# Patient Record
Sex: Female | Born: 1980 | Hispanic: Yes | Marital: Single | Smoking: Never smoker
Health system: Southern US, Community
[De-identification: ages and names within clinical notes are randomized; demographics above are authoritative.]

## PROBLEM LIST (undated history)

## (undated) DIAGNOSIS — O139 Gestational [pregnancy-induced] hypertension without significant proteinuria, unspecified trimester: Secondary | ICD-10-CM

## (undated) HISTORY — PX: NO PAST SURGERIES: SHX2092

---

## 2005-03-15 ENCOUNTER — Ambulatory Visit: Payer: Self-pay | Admitting: Family Medicine

## 2005-05-09 ENCOUNTER — Ambulatory Visit: Payer: Self-pay | Admitting: Family Medicine

## 2007-12-07 ENCOUNTER — Ambulatory Visit: Payer: Self-pay | Admitting: Certified Nurse Midwife

## 2010-01-08 IMAGING — US US OB US >=[ID] SNGL FETUS
1 series · 17 of 28 positions shown · non-contrast
Comparison: none

REASON FOR EXAM: anatomy placenta location  Spanish Interpreter Needed
COMMENTS:

[Series 1: us ob us >=(id) sngl fetus · 17 of 50 slices shown]
[im 1/50]
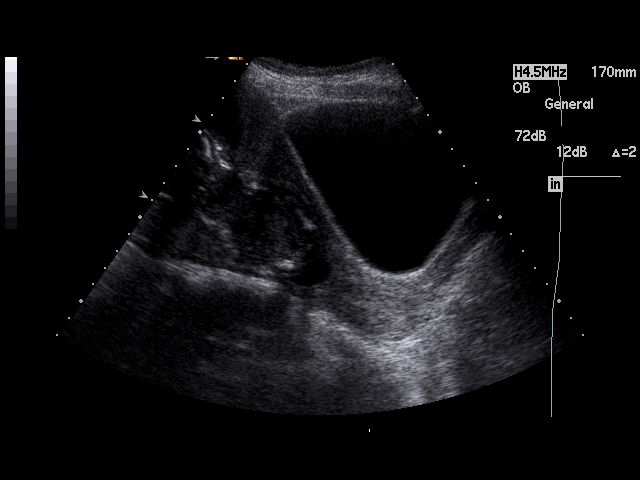
[im 4/50]
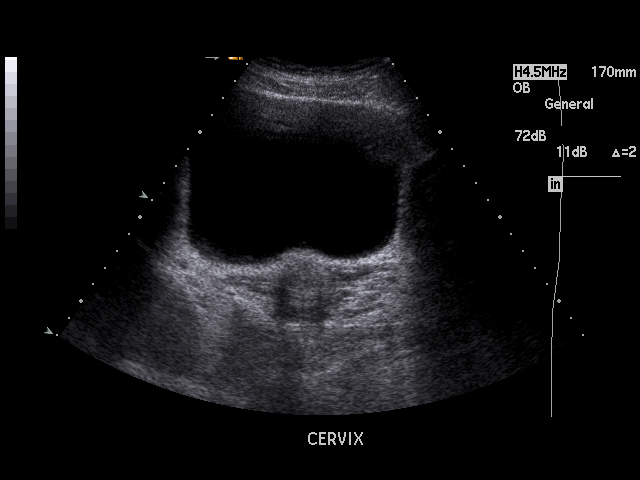
[im 8/50]
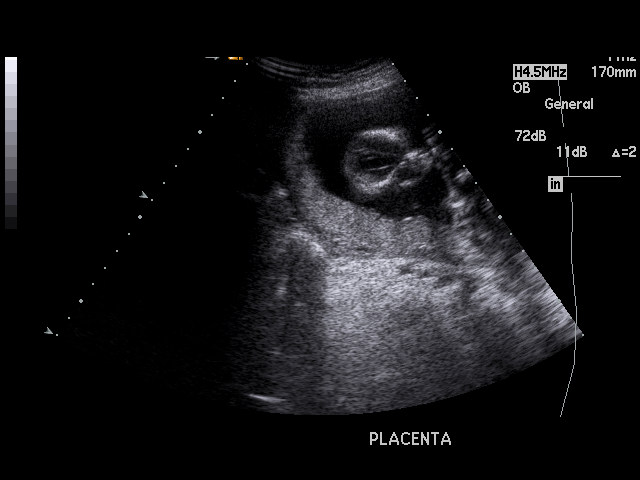
[im 10/50]
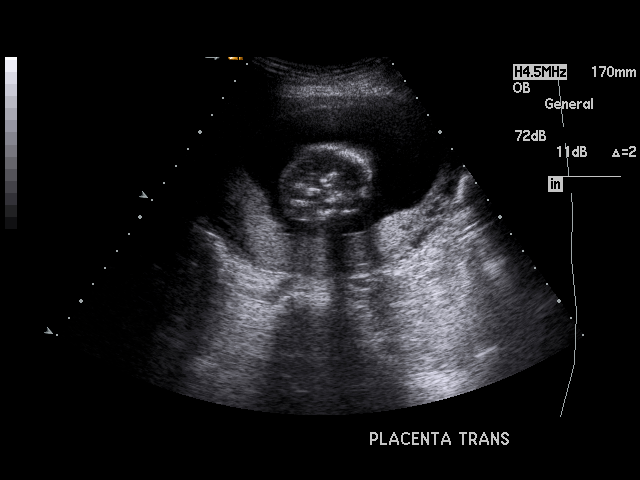
[im 13/50]
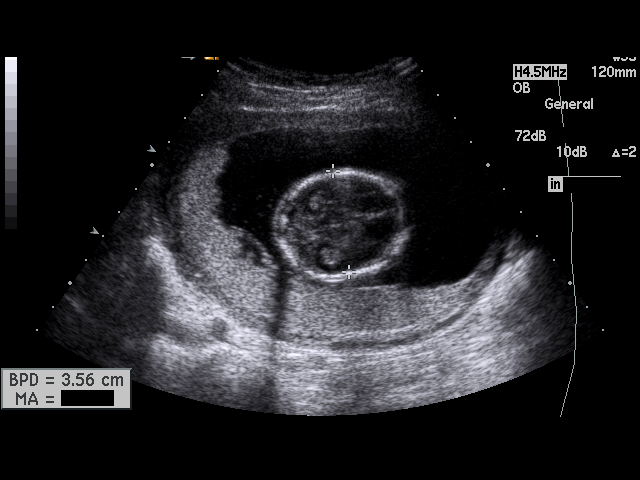
[im 17/50]
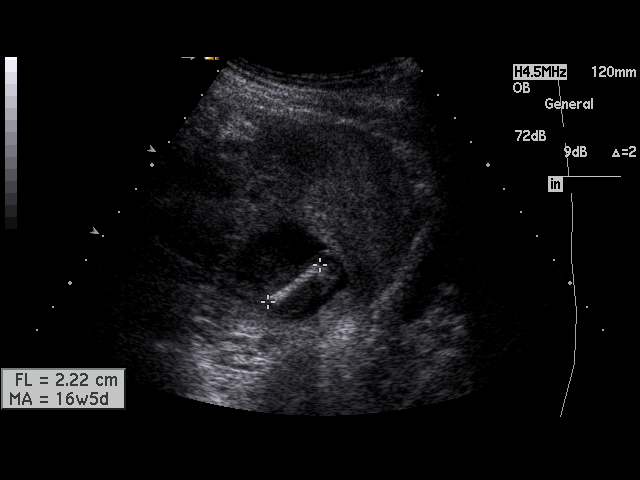
[im 19/50]
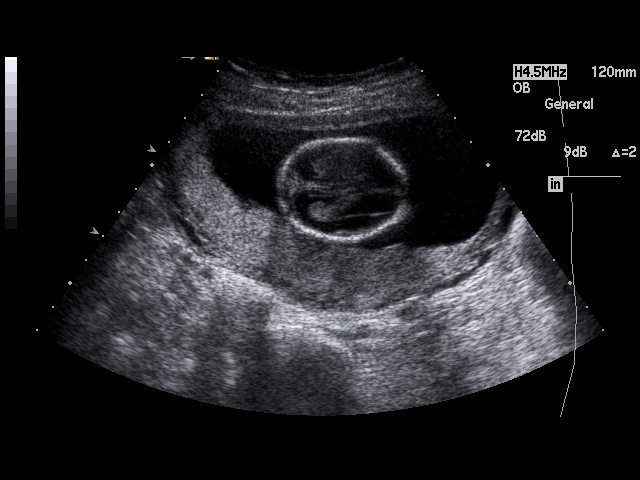
[im 22/50]
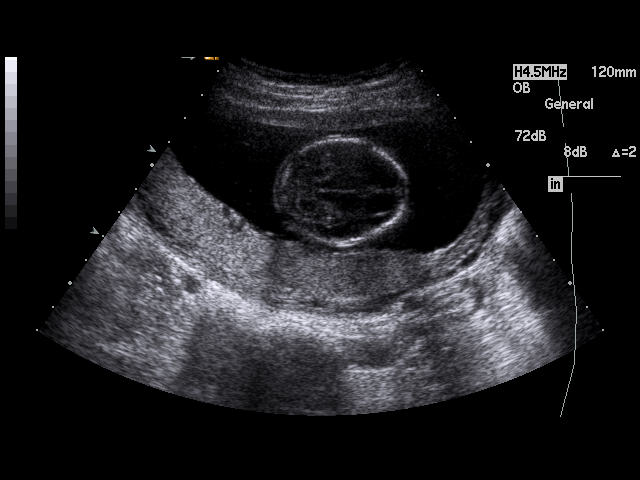
[im 26/50]
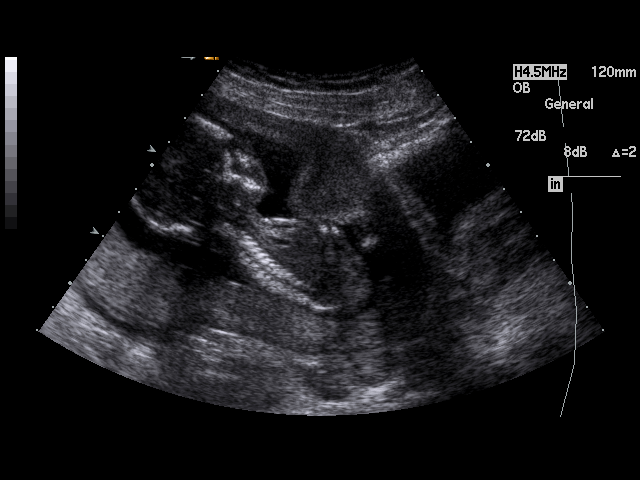
[im 28/50]
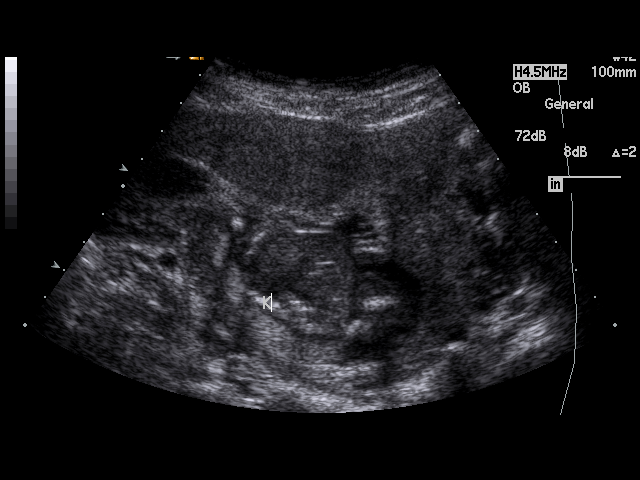
[im 31/50]
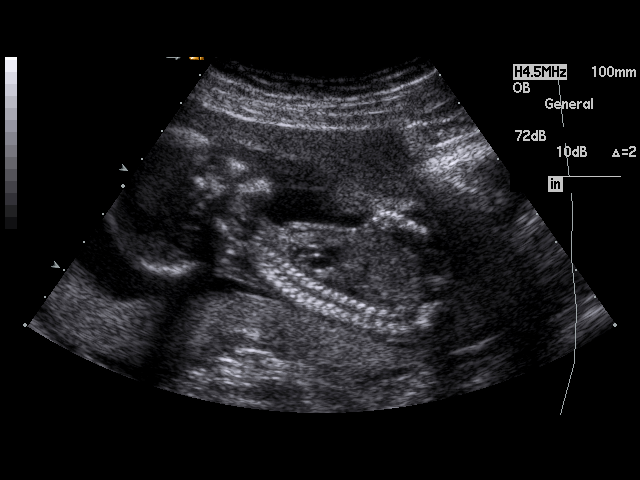
[im 33/50]
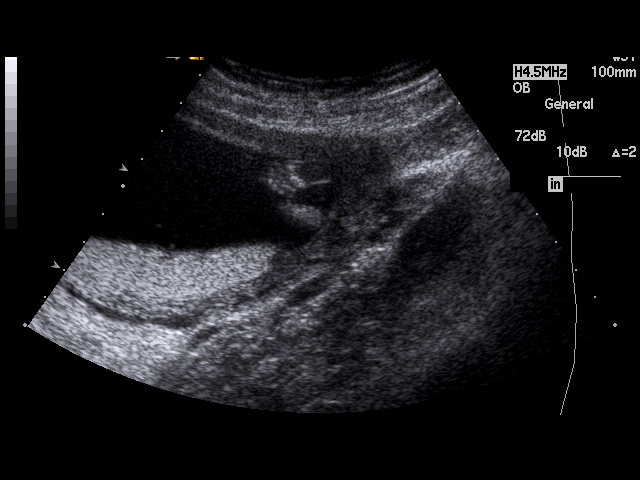
[im 37/50]
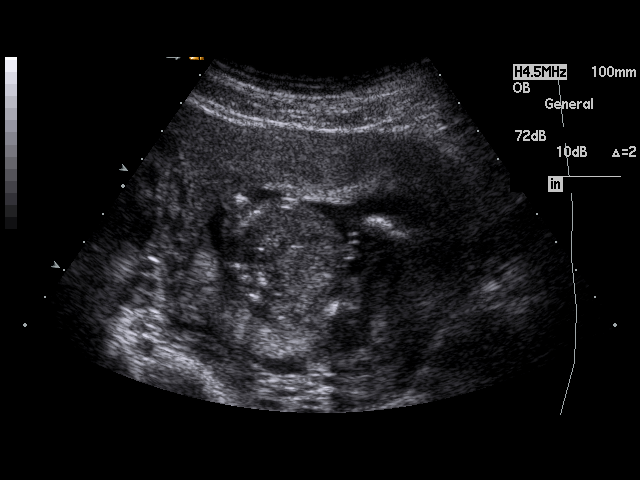
[im 40/50]
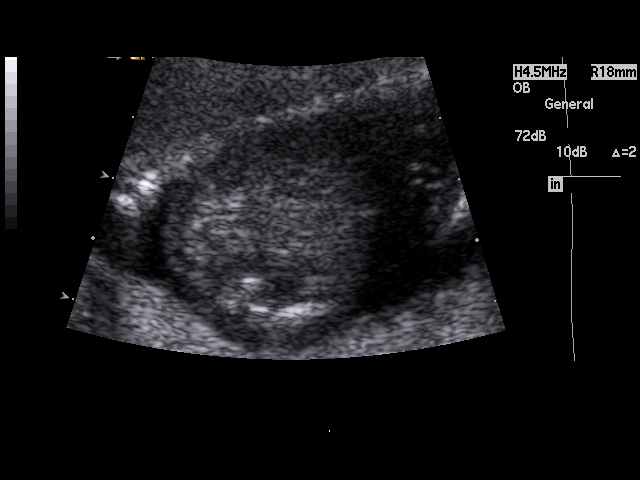
[im 42/50]
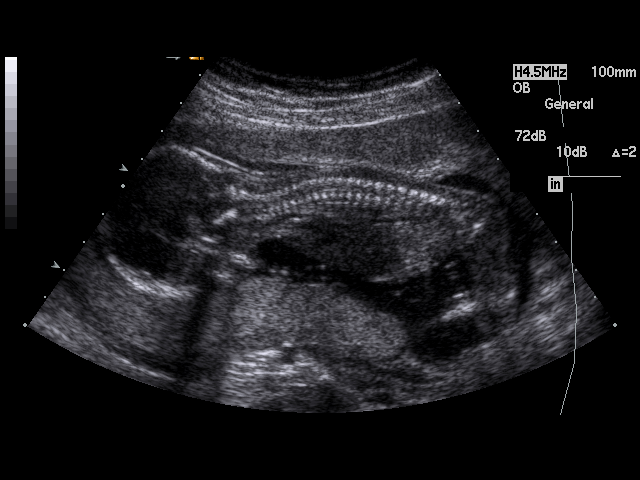
[im 46/50]
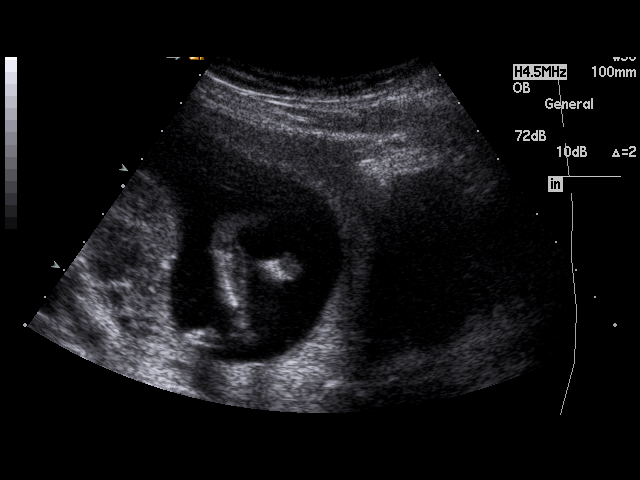
[im 50/50]
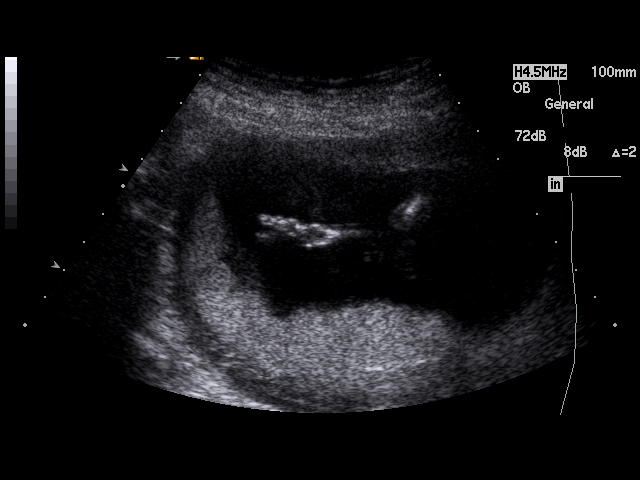

[17 of 28 positions shown; findings below may reference images not displayed]

PROCEDURE:     US  - US OB GREATER/OR EQUAL TO [id number]  - December 07, 2007 [DATE]

RESULT:     There is a viable IUP present with variable presentation. The
placenta is posterior and partially fundal. There is no evidence of placenta
previa. The amnionic fluid volume is estimated to be normal.

The intracranial structures, the craniocervical junction, and the spinal
structures are normal in appearance. A four-chamber heart with a rate 175
beats per minute was demonstrated.

Measured parameters:

BPD: 3.53 cm corresponding to an EGA of [REDACTED] days
HC: 13.83 cm corresponding to an EGA of 17 weeks-7 days
AC: 11.01 cm corresponding to an EGA [REDACTED] days
FL: 2.2 cm corresponding to an EGA of [REDACTED] days

The estimated fetal weight is 191 grams + / - 26 grams.
IMPRESSION: There is a viable IUP with variable presentation with
estimated gestational age of [REDACTED] days plus or minus approximately 10
days. The estimated date of confinement is 17 May, 2008. The placenta is
posterior and partially fundal.

## 2017-10-11 ENCOUNTER — Encounter (INDEPENDENT_AMBULATORY_CARE_PROVIDER_SITE_OTHER): Payer: Self-pay

## 2017-10-11 ENCOUNTER — Ambulatory Visit
Admission: RE | Admit: 2017-10-11 | Discharge: 2017-10-11 | Disposition: A | Discharge status: Home / Self Care | Payer: Self-pay | Source: Ambulatory Visit | Attending: Oncology | Admitting: Oncology

## 2017-10-11 ENCOUNTER — Encounter: Payer: Self-pay | Admitting: *Deleted

## 2017-10-11 ENCOUNTER — Other Ambulatory Visit: Payer: Self-pay

## 2017-10-11 ENCOUNTER — Ambulatory Visit: Payer: Self-pay | Attending: Oncology | Admitting: *Deleted

## 2017-10-11 VITALS — BP 130/86 | HR 62 | Temp 98.4°F | Ht 61.0 in | Wt 143.0 lb

## 2017-10-11 DIAGNOSIS — Z0000 Encounter for general adult medical examination without abnormal findings: Secondary | ICD-10-CM

## 2017-10-11 NOTE — Progress Notes (Signed)
  Subjective:     Patient ID: Jaime Potts, female   DOB: [birth year], 37 y.o.   MRN: [id number]  HPI   Review of Systems     Objective:   Physical Exam  Pulmonary/Chest: Right breast exhibits no inverted nipple, no mass, no nipple discharge, no skin change and no tenderness. Left breast exhibits no inverted nipple, no mass, no nipple discharge, no skin change and no tenderness.       Assessment:     37 year old Hispanic female referred from the [facility] for clinical breast exam and baseline mammogram.  Aquilla Solian, the interpreter present during the interview and exam.  Patient with family history of breast cancer in her sister at age 79.  The patients sister did have genetic testing with a variant of unsignificance.  Patient is considered high risk for breast cancer based on patient's family history of a first degree premenopausal relative with breast cancer.  Clinical breast exam unremarkable. Taught self breast awareness.  Last pap on 07/07/17 was negative without HPV co-testing.  Patient has been screened for eligibility.  She does not have any insurance, Medicare or Medicaid.  She also meets financial eligibility.  Hand-out given on the Affordable Care Act.   Risk Assessment    Risk Scores      10/11/2017   Last edited by: Scarlett Presto, RN   5-year risk: 0.5 %   Lifetime risk: 13.7 %            Plan:     Baseline screening mammogram ordered.  Will follow-up per [facility] protocol.

## 2017-10-11 NOTE — Patient Instructions (Signed)
Gave patient hand-out, Women Staying Healthy, Active and Well from [facility], with education on breast health, pap smears, heart and colon health. 

## 2017-10-12 NOTE — Progress Notes (Signed)
Letter mailed from the [facility] to inform patient of her normal mammogram results.  Patient is to follow-up with annual screening at age 37 per radiology recommendations.  HSIS to [place].

## 2019-01-11 NOTE — L&D Delivery Note (Signed)
Delivery Note At 5:54 AM a viable female was delivered via Vaginal, Spontaneous (Presentation:      ).  APGAR:2/8 , ; weight  .   Placenta status:intact Spontaneous, Intact.  Cord:   with the following complications:  nonreassuring fetal monitoring with worsening of baby's HR ---> c/s converted to stat .Pt taken back to OR emergently once the stat c/s called . Placement of mother on OR table and then noted the baby had delivered and was between her legs . Ashen colored . Tight nuchal cord x 2 reduced . Floppy female passed to nursery staff . APGARS 2/ 8. Cord gas obtained = pending .  Placenta delivered 5 minutes after ? Abruption  Anesthesia:  none Episiotomy:  none Lacerations:  none Suture Repair: none Est. Blood Loss (mL):  250 cc  Mom to postpartum.  Baby to NICU.  Ihor Austin Hanzel Pizzo 09/19/2019, 6:13 AM

## 2019-04-19 LAB — OB RESULTS CONSOLE GC/CHLAMYDIA
Chlamydia: NEGATIVE
Gonorrhea: NEGATIVE

## 2019-04-19 LAB — OB RESULTS CONSOLE VARICELLA ZOSTER ANTIBODY, IGG: Varicella: IMMUNE

## 2019-04-19 LAB — OB RESULTS CONSOLE RUBELLA ANTIBODY, IGM: Rubella: IMMUNE

## 2019-04-19 LAB — OB RESULTS CONSOLE HEPATITIS B SURFACE ANTIGEN: Hepatitis B Surface Ag: NEGATIVE

## 2019-04-19 LAB — OB RESULTS CONSOLE RPR: RPR: NONREACTIVE

## 2019-04-19 LAB — OB RESULTS CONSOLE HIV ANTIBODY (ROUTINE TESTING): HIV: NONREACTIVE

## 2019-09-17 ENCOUNTER — Inpatient Hospital Stay
Admission: EM | Admit: 2019-09-17 | Discharge: 2019-09-24 | DRG: 806 | Disposition: A | Discharge status: Home / Self Care | Payer: Medicaid Other | Attending: Certified Nurse Midwife | Admitting: Certified Nurse Midwife

## 2019-09-17 ENCOUNTER — Other Ambulatory Visit: Payer: Self-pay

## 2019-09-17 DIAGNOSIS — R030 Elevated blood-pressure reading, without diagnosis of hypertension: Secondary | ICD-10-CM | POA: Diagnosis present

## 2019-09-17 DIAGNOSIS — O1413 Severe pre-eclampsia, third trimester: Secondary | ICD-10-CM | POA: Diagnosis present

## 2019-09-17 DIAGNOSIS — O9081 Anemia of the puerperium: Secondary | ICD-10-CM | POA: Diagnosis not present

## 2019-09-17 DIAGNOSIS — O1414 Severe pre-eclampsia complicating childbirth: Secondary | ICD-10-CM | POA: Diagnosis present

## 2019-09-17 DIAGNOSIS — Z20822 Contact with and (suspected) exposure to covid-19: Secondary | ICD-10-CM | POA: Diagnosis present

## 2019-09-17 DIAGNOSIS — D62 Acute posthemorrhagic anemia: Secondary | ICD-10-CM | POA: Diagnosis not present

## 2019-09-17 DIAGNOSIS — Z3A32 32 weeks gestation of pregnancy: Secondary | ICD-10-CM

## 2019-09-17 HISTORY — DX: Gestational (pregnancy-induced) hypertension without significant proteinuria, unspecified trimester: O13.9

## 2019-09-17 LAB — CBC
HCT: 38 % (ref 36.0–46.0)
Hemoglobin: 13.3 g/dL (ref 12.0–15.0)
MCH: 30 pg (ref 26.0–34.0)
MCHC: 35 g/dL (ref 30.0–36.0)
MCV: 85.8 fL (ref 80.0–100.0)
Platelets: 117 10*3/uL — ABNORMAL LOW (ref 150–400)
RBC: 4.43 MIL/uL (ref 3.87–5.11)
RDW: 13.2 % (ref 11.5–15.5)
WBC: 8.6 10*3/uL (ref 4.0–10.5)
nRBC: 0 % (ref 0.0–0.2)

## 2019-09-17 LAB — SARS CORONAVIRUS 2 BY RT PCR (HOSPITAL ORDER, PERFORMED IN ~~LOC~~ HOSPITAL LAB): SARS Coronavirus 2: NEGATIVE

## 2019-09-17 LAB — COMPREHENSIVE METABOLIC PANEL
ALT: 39 U/L (ref 0–44)
AST: 43 U/L — ABNORMAL HIGH (ref 15–41)
Albumin: 2.3 g/dL — ABNORMAL LOW (ref 3.5–5.0)
Alkaline Phosphatase: 191 U/L — ABNORMAL HIGH (ref 38–126)
Anion gap: 7 (ref 5–15)
BUN: 16 mg/dL (ref 6–20)
CO2: 20 mmol/L — ABNORMAL LOW (ref 22–32)
Calcium: 8 mg/dL — ABNORMAL LOW (ref 8.9–10.3)
Chloride: 108 mmol/L (ref 98–111)
Creatinine, Ser: 0.97 mg/dL (ref 0.44–1.00)
GFR calc Af Amer: >60 mL/min (ref >60)
GFR calc non Af Amer: >60 mL/min (ref >60)
Glucose, Bld: 78 mg/dL (ref 70–99)
Potassium: 4.3 mmol/L (ref 3.5–5.1)
Sodium: 135 mmol/L (ref 135–145)
Total Bilirubin: 0.4 mg/dL (ref 0.3–1.2)
Total Protein: 5.2 g/dL — ABNORMAL LOW (ref 6.5–8.1)

## 2019-09-17 LAB — PROTEIN / CREATININE RATIO, URINE
Creatinine, Urine: 50 mg/dL
Protein Creatinine Ratio: 14.74 mg/mg{Cre} — ABNORMAL HIGH (ref 0.00–0.15)
Total Protein, Urine: 737 mg/dL

## 2019-09-17 LAB — TYPE AND SCREEN
ABO/RH(D): O POS
Antibody Screen: NEGATIVE

## 2019-09-17 LAB — ABO/RH: ABO/RH(D): O POS

## 2019-09-17 MED ORDER — MAGNESIUM SULFATE 40 GM/1000ML IV SOLN
2.0000 g/h | INTRAVENOUS | Status: DC
  Administered 2019-09-18: 2 g/h via INTRAVENOUS
  Administered 2019-09-19: 1 g/h via INTRAVENOUS
  Filled 2019-09-17 (×3): qty 1000

## 2019-09-17 MED ORDER — ACETAMINOPHEN 325 MG PO TABS: 650.0000 mg | ORAL_TABLET | ORAL | Status: DC | PRN

## 2019-09-17 MED ORDER — LACTATED RINGERS IV SOLN: INTRAVENOUS | Status: DC

## 2019-09-17 MED ORDER — LABETALOL HCL 5 MG/ML IV SOLN
20.0000 mg | INTRAVENOUS | Status: DC | PRN
  Administered 2019-09-17: 20 mg via INTRAVENOUS
  Filled 2019-09-17 (×3): qty 4

## 2019-09-17 MED ORDER — BETAMETHASONE SOD PHOS & ACET 6 (3-3) MG/ML IJ SUSP
INTRAMUSCULAR | Status: AC
  Filled 2019-09-17: qty 5

## 2019-09-17 MED ORDER — LABETALOL HCL 5 MG/ML IV SOLN
40.0000 mg | INTRAVENOUS | Status: DC | PRN
  Administered 2019-09-20 – 2019-09-21 (×2): 40 mg via INTRAVENOUS
  Filled 2019-09-17 (×2): qty 8

## 2019-09-17 MED ORDER — ZOLPIDEM TARTRATE 5 MG PO TABS: 5.0000 mg | ORAL_TABLET | Freq: Every evening | ORAL | Status: DC | PRN

## 2019-09-17 MED ORDER — LABETALOL HCL 5 MG/ML IV SOLN
80.0000 mg | INTRAVENOUS | Status: DC | PRN
  Administered 2019-09-17: 80 mg via INTRAVENOUS
  Filled 2019-09-17 (×2): qty 16

## 2019-09-17 MED ORDER — PRENATAL MULTIVITAMIN CH
1.0000 | ORAL_TABLET | Freq: Every day | ORAL | Status: DC
  Administered 2019-09-18 – 2019-09-19 (×2): 1 via ORAL
  Filled 2019-09-17 (×2): qty 1

## 2019-09-17 MED ORDER — CALCIUM CARBONATE ANTACID 500 MG PO CHEW: 2.0000 | CHEWABLE_TABLET | ORAL | Status: DC | PRN

## 2019-09-17 MED ORDER — MAGNESIUM SULFATE BOLUS VIA INFUSION
4.0000 g | Freq: Once | INTRAVENOUS | Status: AC
  Administered 2019-09-17: 4 g via INTRAVENOUS
  Filled 2019-09-17: qty 1000

## 2019-09-17 MED ORDER — HYDRALAZINE HCL 20 MG/ML IJ SOLN
10.0000 mg | INTRAMUSCULAR | Status: DC | PRN
  Administered 2019-09-17 – 2019-09-20 (×4): 10 mg via INTRAVENOUS
  Filled 2019-09-17 (×2): qty 1

## 2019-09-17 MED ORDER — CALCIUM GLUCONATE 10 % IV SOLN
INTRAVENOUS | Status: AC
  Filled 2019-09-17: qty 10

## 2019-09-17 MED ORDER — HYDRALAZINE HCL 20 MG/ML IJ SOLN
5.0000 mg | INTRAMUSCULAR | Status: DC | PRN
  Administered 2019-09-17 – 2019-09-21 (×9): 5 mg via INTRAVENOUS
  Filled 2019-09-17 (×5): qty 1

## 2019-09-17 MED ORDER — HYDRALAZINE HCL 20 MG/ML IJ SOLN
INTRAMUSCULAR | Status: AC
  Administered 2019-09-19: 5 mg via INTRAVENOUS
  Filled 2019-09-17: qty 1

## 2019-09-17 MED ORDER — LABETALOL HCL 5 MG/ML IV SOLN
20.0000 mg | INTRAVENOUS | Status: DC | PRN
  Administered 2019-09-19 – 2019-09-21 (×3): 20 mg via INTRAVENOUS
  Filled 2019-09-17: qty 4

## 2019-09-17 MED ORDER — LABETALOL HCL 5 MG/ML IV SOLN
40.0000 mg | INTRAVENOUS | Status: DC | PRN
  Administered 2019-09-17: 40 mg via INTRAVENOUS
  Filled 2019-09-17: qty 8

## 2019-09-17 MED ORDER — DOCUSATE SODIUM 100 MG PO CAPS
100.0000 mg | ORAL_CAPSULE | Freq: Every day | ORAL | Status: DC
  Administered 2019-09-18 – 2019-09-19 (×2): 100 mg via ORAL
  Filled 2019-09-17 (×2): qty 1

## 2019-09-17 MED ORDER — BETAMETHASONE SOD PHOS & ACET 6 (3-3) MG/ML IJ SUSP
12.0000 mg | INTRAMUSCULAR | Status: AC
  Administered 2019-09-17 – 2019-09-18 (×2): 12 mg via INTRAMUSCULAR
  Filled 2019-09-17: qty 5

## 2019-09-17 NOTE — Progress Notes (Signed)
Pt's first BP in severe range. IV labetalol ordered promptly at 1925. IV unable to be obtained and po nifedipine ordered. However, iv obtained prior to po nifedipine being given.

## 2019-09-17 NOTE — H&P (Addendum)
OB ADMISSION/ HISTORY & PHYSICAL:  Admission Date: 09/17/2019  6:59 PM  Admit Diagnosis: Elevated BP in pregnancy  Jaime Potts is a 39 y.o. [id number] at 32+4wks presenting for elevated BP to severe range at her PNV at [facility]. No headache now, 1 last week. Flashing lights behind eyes last week, now resolved. No RUB, no epigastric pain. +bilateral le edema and ue edema. No SOB. First BP on arrival 194/86.  Prior pregnancy with PreE, delivered at term. Last NSVD no complications.  Dated by ultrasound at 11 wks Prior pregnancies 14 and 10 yrs ago.  Pt is Spanish-speaking only, and was seen with interpreter to discuss care.  Prenatal History: [id number]   EDC : 11/08/2019, by Ultrasound at 11wks Prenatal care at [facility] Prenatal course complicated by  - elevated glucola, normal 3hr - hx of preE  Medical / Surgical History :  Past medical history:  Past Medical History:  Diagnosis Date  . Pregnancy induced hypertension      Past surgical history:  Past Surgical History:  Procedure Laterality Date  . NO PAST SURGERIES      Family History:  Family History  Problem Relation Age of Onset  . Breast cancer Sister 32     Social History:  reports that she has never smoked. She has never used smokeless tobacco. She reports that she does not drink alcohol and does not use drugs.   Allergies: Patient has no known allergies.    Current Medications at time of admission:  Prior to Admission medications   Medication Sig Start Date End Date Taking? Authorizing Provider  acetaminophen (TYLENOL) 500 MG tablet Take 500 mg by mouth every 6 (six) hours as needed.   Yes [provider]  prenatal vitamin w/FE, FA (PRENATAL 1 + 1) 27-1 MG TABS tablet Take 1 tablet by mouth daily at 12 noon.   Yes [provider]     Review of Systems: Active FM NO headache, SOB, visual changes, RUQ pain, epigastric or back pain.   Physical Exam:  VS: Blood pressure (!)  152/76, pulse 60, temperature 98.1 F (36.7 C), temperature source Oral, resp. rate 20, height 5' (1.524 m), weight 80.7 kg, last menstrual period 02/05/2019, SpO2 98 %.  General: alert and oriented, appears NAD Heart: RRR Lungs: Clear lung fields x6 LE: 2+ bilateral LE reflexes Abdomen: Gravid, soft and non-tender, non-distended Extremities: 3+ pitting edema, 2 beats clonus  FHT: 135, moderate variability, +accels, occasional variable decels TOCO:rare SVE:    /   /   deferred     Prenatal Labs: Blood type/Rh --/--/O POS (09/07 1953)  Antibody screen neg  Rubella Immune  Varicella Immune  RPR NR  HBsAg Neg  HIV NR  GC neg  Chlamydia neg  Genetic screening negative  1 hour GTT elevated  3 hour GTT normal  GBS pending   No results found.  Assessment: 102w4d weeks gestation PreE with severe features FHR category II with occasional variables, expected for gestational age   Plan:  Admit for inpatient secondary to severe range BP with significant urine protein:cr ratio >12,000. Plts low at 117.   AST slightly elevated at 43. ALT wnl. Urine protein:ratio here still pending, but significantly elevated at outside lab.  Covid test negative  1. Continue hospital monitoring of maternal and fetal condition; 2. Repeat renal, hepatic and platelet functions 1-2 times/week, and will tomorrow to trend plts and liver enzymes, 3. Fetal monitoring reassuring. For now, 2x daily NST's.  4. Will get fetal growth, umbilical artery dopplers and AFI. If all normal, will start twice weekly U/S reassessment of AFI. 5. Continue iv antihypertensives to control BP to 150s range, and <160. Will keep IV access for control if needed. 6. Deliver if evidence of worsening headaches, BP's persistently >160 mmHg systolic, >100 mmHg diastolic; persistent headaches or visual signs;  abnormal renal or hepatic function tests ( >2X Normal), platelet count <100K/uL; bleeding; abnormal fetal testing.  7.  Deliver at 34 weeks if stable before then. 8. She has received 1 dose steroids for fetal lung maturity. Second dose due tomorrow night at 10pm. 9. 24hours of magnesium on admission, and will monitor for discontinuation based on results and symptoms tomorrow. 10. P:C ratio in our system still pending. Once established proteinuria, will not need to repeat. 11. GBS pending  Will repeat labs 1-2x daily until trend established. Next blood draw tomorrow at 10am scheduled.  Regular diet for now. Ambulation as tolerated.

## 2019-09-17 NOTE — OB Triage Note (Signed)
Pt presents to unit sent [facility] due to increased BP in office and facial swelling. Pt reports noticing the increased swelling for the past 2 weeks and some blurry vision but not currently. Pt denies HA and right epigastric pain. Pt reports +FM, denies ctx, LOF, and vaginal bleeding.

## 2019-09-18 ENCOUNTER — Inpatient Hospital Stay: Payer: Medicaid Other

## 2019-09-18 LAB — COMPREHENSIVE METABOLIC PANEL
ALT: 43 U/L (ref 0–44)
AST: 49 U/L — ABNORMAL HIGH (ref 15–41)
Albumin: 2.4 g/dL — ABNORMAL LOW (ref 3.5–5.0)
Alkaline Phosphatase: 195 U/L — ABNORMAL HIGH (ref 38–126)
Anion gap: 11 (ref 5–15)
BUN: 14 mg/dL (ref 6–20)
CO2: 17 mmol/L — ABNORMAL LOW (ref 22–32)
Calcium: 7.5 mg/dL — ABNORMAL LOW (ref 8.9–10.3)
Chloride: 101 mmol/L (ref 98–111)
Creatinine, Ser: 0.96 mg/dL (ref 0.44–1.00)
GFR calc Af Amer: >60 mL/min (ref >60)
GFR calc non Af Amer: >60 mL/min (ref >60)
Glucose, Bld: 217 mg/dL — ABNORMAL HIGH (ref 70–99)
Potassium: 4.3 mmol/L (ref 3.5–5.1)
Sodium: 129 mmol/L — ABNORMAL LOW (ref 135–145)
Total Bilirubin: 0.4 mg/dL (ref 0.3–1.2)
Total Protein: 5.3 g/dL — ABNORMAL LOW (ref 6.5–8.1)

## 2019-09-18 LAB — CBC
HCT: 40.6 % (ref 36.0–46.0)
Hemoglobin: 14.3 g/dL (ref 12.0–15.0)
MCH: 30.5 pg (ref 26.0–34.0)
MCHC: 35.2 g/dL (ref 30.0–36.0)
MCV: 86.6 fL (ref 80.0–100.0)
Platelets: 142 10*3/uL — ABNORMAL LOW (ref 150–400)
RBC: 4.69 MIL/uL (ref 3.87–5.11)
RDW: 13.5 % (ref 11.5–15.5)
WBC: 9 10*3/uL (ref 4.0–10.5)
nRBC: 0 % (ref 0.0–0.2)

## 2019-09-18 LAB — GROUP B STREP BY PCR: Group B strep by PCR: NEGATIVE

## 2019-09-18 MED ORDER — TERBUTALINE SULFATE 1 MG/ML IJ SOLN: 0.2500 mg | Freq: Once | INTRAMUSCULAR | Status: DC | PRN

## 2019-09-18 MED ORDER — MISOPROSTOL 25 MCG QUARTER TABLET
ORAL_TABLET | ORAL | Status: AC
  Filled 2019-09-18: qty 2

## 2019-09-18 MED ORDER — MISOPROSTOL 25 MCG QUARTER TABLET
25.0000 ug | ORAL_TABLET | ORAL | Status: DC
  Administered 2019-09-18 (×2): 25 ug via VAGINAL
  Filled 2019-09-18: qty 1

## 2019-09-18 MED ORDER — MISOPROSTOL 25 MCG QUARTER TABLET
25.0000 ug | ORAL_TABLET | ORAL | Status: DC
  Administered 2019-09-18 (×2): 25 ug via ORAL
  Filled 2019-09-18: qty 1

## 2019-09-18 MED ORDER — MISOPROSTOL 25 MCG QUARTER TABLET: 25.0000 ug | ORAL_TABLET | ORAL | Status: DC | PRN

## 2019-09-18 NOTE — Progress Notes (Signed)
Patient ID: Jaime Potts, female   DOB: [birth year], 39 y.o.   MRN: [id number] Dr Francena Hanly notes reviewed and update by her in am .  Currently following her for severe preeclamsia. BP difficult to control with labetalol and hydralazine . LAbs notable for elevated ast ( 43 , normal <41) and plt 117 --- > now 142 P/Cr ratio marked elevation 14.7 Pt has received one dose of Betamthasone and she is on Magnesium for Sz prophylaxis .   u/s :   CLINICAL DATA:  Preeclampsia  EXAM: OBSTETRICAL ULTRASOUND >14 WKS AND BIOPHYSICAL PROFILE  FINDINGS: Number of Fetuses: 1  Heart Rate:  129 bpm  Movement: Yes  Presentation: Cephalic  Previa: No  Placental Location: Anterior  Amniotic Fluid (Subjective): Normal  Amniotic Fluid (Objective):  Vertical pocket 5.6cm  AFI 13.2 cm (5%ile= 8.6 cm, 95%= 24.2 cm for 32 wks)  FETAL BIOMETRY  BPD: 8.36cm 33w 5d  HC: 27.88cm  30w   4d  AC: 26.85cm  31w   0d  FL: 5.86cm  30w   4d  Current Mean GA: 31w 1d              [place] EDC: 11/19/2019  Assigned GA: 321w d     Assigned EDC: 11/12/2019  Estimated Fetal Weight: 1681.5g 11.6%ile  FETAL ANATOMY  Lateral Ventricles: Appears normal  Thalami/CSP: Not visualized  Posterior Fossa: Not visualized  Nuchal Region: Not visualized  Upper Lip: Not visualized  Spine: Appears normal  4 Chamber Heart on Left: Appears normal  LVOT: Appears normal  RVOT: Appears normal  Stomach on Left: Appears normal  3 Vessel Cord: Appears normal  Cord Insertion site: Appears normal  Kidneys: Appears normal  Bladder: Appears normal  Extremities: Not visualized  Sex: Female  Technical Limitations: Fetal position and advanced gestational age limits evaluation.  Maternal Findings:  Cervix:  Cervical length 4.3 cm.  Cervix is closed.  BIOPHYSICAL PROFILE  Movement: 2 Time: 30 minutes  Breathing: 0  Tone:  2  Amniotic Fluid: 2  Total  Score:  6  A:  severe preeclampsia with significant proteinuria and difficult to control severe bp .   P: recommend induction of labor today and administering 2nd dose of steroids . If BP worsen consider moving to c/s I will discuss this with the patient and translator this afternoon .

## 2019-09-18 NOTE — Progress Notes (Signed)
Patient ID: Jaime Potts, female   DOB: 15-Jul-1980, 39 y.o.   MRN: [id number] [name] regarding induction of labor . Translator present . Fully explained the pro and cons of induction vs waiting . All questions answered . Proceed with cytotec induction .

## 2019-09-18 NOTE — Progress Notes (Signed)
Patient ID: Jaime Potts, female   DOB: [birth year], 39 y.o.   MRN: [id number] 25 mcg cytotec buccal and vaginal placed cx closed / 50 % and OOP VTX

## 2019-09-18 NOTE — Progress Notes (Signed)
ANTEPARTUM PROGRESS NOTE  Jaime Potts is a 39 y.o. [id number] at [redacted]w[redacted]d who is admitted for Pre-E.   Estimated Date of Delivery: 11/08/19  Length of Stay:  1 Days. Admitted 09/17/2019  Subjective: Denies HA, epigastric pain, and SOB  She reports:  -active fetal movement -no leakage of fluid -no vaginal bleeding -no contractions  Vitals:  BP 136/72 (BP Location: Right Arm)   Pulse 62   Temp 97.8 F (36.6 C) (Oral)   Resp 16   Ht 5' (1.524 m)   Wt 87.4 kg   LMP 02/05/2019 (Exact Date)   SpO2 97%   BMI 37.63 kg/m  Physical Examination: General:   alert and cooperative, generalized swelling including the face  Skin:  normal  Neurologic:    Alert & oriented x 3  Lungs:   nl effort  Heart:   regular rate and rhythm  Abdomen:  soft, non-tender; bowel sounds normal; no masses,  no organomegaly  Pelvis:  Exam deferred.  Extremities: : non-tender, symmetric, 3+ edema bilaterally.  DTRs: brisk, no clonus    Fetal monitoring: NST @ 0700 baseline 120bpm, Moderate variability, Accelerations present, Decelerations absent - Reactive Uterine activity: quiet  Results for orders placed or performed during the hospital encounter of 09/17/19 (from the past 48 hour(s))  Comprehensive metabolic panel     Status: Abnormal   Collection Time: 09/17/19  7:24 PM  Result Value Ref Range   Sodium 135 135 - 145 mmol/L   Potassium 4.3 3.5 - 5.1 mmol/L   Chloride 108 98 - 111 mmol/L   CO2 20 (L) 22 - 32 mmol/L   Glucose, Bld 78 70 - 99 mg/dL    Comment: Glucose reference range applies only to samples taken after fasting for at least 8 hours.   BUN 16 6 - 20 mg/dL   Creatinine, Ser 3.22 0.44 - 1.00 mg/dL   Calcium 8.0 (L) 8.9 - 10.3 mg/dL   Total Protein 5.2 (L) 6.5 - 8.1 g/dL   Albumin 2.3 (L) 3.5 - 5.0 g/dL   AST 43 (H) 15 - 41 U/L   ALT 39 0 - 44 U/L   Alkaline Phosphatase 191 (H) 38 - 126 U/L   Total Bilirubin 0.4 0.3 - 1.2 mg/dL   GFR calc non Af Amer >60 >60 mL/min   GFR calc Af  Amer >60 >60 mL/min   Anion gap 7 5 - 15    Comment: Performed at [facility], [address] [place], [place] [address]  CBC     Status: Abnormal   Collection Time: 09/17/19  7:24 PM  Result Value Ref Range   WBC 8.6 4.0 - 10.5 K/uL   RBC 4.43 3.87 - 5.11 MIL/uL   Hemoglobin 13.3 12.0 - 15.0 g/dL   HCT 70.6 36 - 46 %   MCV 85.8 80.0 - 100.0 fL   MCH 30.0 26.0 - 34.0 pg   MCHC 35.0 30.0 - 36.0 g/dL   RDW 23.7 62.8 - 31.5 %   Platelets 117 (L) 150 - 400 K/uL   nRBC 0.0 0.0 - 0.2 %    Comment: Performed at [facility], [address] [place], [place] [address]  Protein / creatinine ratio, urine     Status: Abnormal   Collection Time: 09/17/19  7:24 PM  Result Value Ref Range   Creatinine, Urine 50 mg/dL   Total Protein, Urine 737 mg/dL    Comment: RESULT CONFIRMED BY MANUAL DILUTION/RWW NO NORMAL RANGE ESTABLISHED  FOR THIS TEST    Protein Creatinine Ratio 14.74 (H) 0.00 - 0.15 mg/mg[Cre]    Comment: Performed at [facility], [address] [place], [place] [address]  SARS Coronavirus 2 by RT PCR (hospital order, performed in [facility] hospital lab) Nasopharyngeal Nasopharyngeal Swab     Status: None   Collection Time: 09/17/19  7:53 PM   Specimen: Nasopharyngeal Swab  Result Value Ref Range   SARS Coronavirus 2 NEGATIVE NEGATIVE    Comment: (NOTE) SARS-CoV-2 target nucleic acids are NOT DETECTED.  The SARS-CoV-2 RNA is generally detectable in upper and lower respiratory specimens during the acute phase of infection. The lowest concentration of SARS-CoV-2 viral copies this assay can detect is 250 copies / mL. A negative result does not preclude SARS-CoV-2 infection and should not be used as the sole basis for treatment or other patient management decisions.  A negative result may occur with improper specimen collection / handling, submission of specimen other than nasopharyngeal swab, presence of viral mutation(s) within the areas targeted  by this assay, and inadequate number of viral copies (<250 copies / mL). A negative result must be combined with clinical observations, patient history, and epidemiological information.  Fact Sheet for Patients:   BoilerBrush.com.cy  Fact Sheet for Healthcare Providers: https://pope.com/  This test is not yet approved or  cleared by the [place] FDA and has been authorized for detection and/or diagnosis of SARS-CoV-2 by FDA under an Emergency Use Authorization (EUA).  This EUA will remain in effect (meaning this test can be used) for the duration of the COVID-19 declaration under Section 564(b)(1) of the Act, 21 U.S.C. section 360bbb-3(b)(1), unless the authorization is terminated or revoked sooner.  Performed at [facility], [address] [place], [place] [address]   Type and screen     Status: None   Collection Time: 09/17/19  7:53 PM  Result Value Ref Range   ABO/RH(D) O POS    Antibody Screen NEG    Sample Expiration      09/20/2019,2359 Performed at [facility], [address] [place], [place] [address]   ABO/Rh     Status: None   Collection Time: 09/17/19 10:42 PM  Result Value Ref Range   ABO/RH(D)      O POS Performed at [facility], [address] [place], [place] [address]   Group B strep by PCR     Status: None   Collection Time: 09/17/19 11:22 PM   Specimen: Vaginal/Rectal; Genital  Result Value Ref Range   Group B strep by PCR NEGATIVE NEGATIVE    Comment: (NOTE) Intrapartum testing with Xpert GBS assay should be used as an adjunct to other methods available and not used to replace antepartum testing (at 35-[redacted] weeks gestation). Performed at [facility], [address] [place], [place] [address]   CBC on admission     Status: Abnormal   Collection Time: 09/18/19  9:46 AM  Result Value Ref Range   WBC 9.0 4.0 - 10.5 K/uL   RBC 4.69 3.87 - 5.11 MIL/uL    Hemoglobin 14.3 12.0 - 15.0 g/dL   HCT 12.7 36 - 46 %   MCV 86.6 80.0 - 100.0 fL   MCH 30.5 26.0 - 34.0 pg   MCHC 35.2 30.0 - 36.0 g/dL   RDW 51.7 00.1 - 74.9 %   Platelets 142 (L) 150 - 400 K/uL   nRBC 0.0 0.0 - 0.2 %    Comment: Performed  at [facility], [address] [place], [place] [address]  Comprehensive metabolic panel     Status: Abnormal   Collection Time: 09/18/19  9:46 AM  Result Value Ref Range   Sodium 129 (L) 135 - 145 mmol/L   Potassium 4.3 3.5 - 5.1 mmol/L   Chloride 101 98 - 111 mmol/L   CO2 17 (L) 22 - 32 mmol/L   Glucose, Bld 217 (H) 70 - 99 mg/dL    Comment: Glucose reference range applies only to samples taken after fasting for at least 8 hours.   BUN 14 6 - 20 mg/dL   Creatinine, Ser 6.38 0.44 - 1.00 mg/dL   Calcium 7.5 (L) 8.9 - 10.3 mg/dL   Total Protein 5.3 (L) 6.5 - 8.1 g/dL   Albumin 2.4 (L) 3.5 - 5.0 g/dL   AST 49 (H) 15 - 41 U/L   ALT 43 0 - 44 U/L   Alkaline Phosphatase 195 (H) 38 - 126 U/L   Total Bilirubin 0.4 0.3 - 1.2 mg/dL   GFR calc non Af Amer >60 >60 mL/min   GFR calc Af Amer >60 >60 mL/min   Anion gap 11 5 - 15    Comment: Performed at [facility], [address], [place], [place] [address]      Current scheduled medications . betamethasone acetate-betamethasone sodium phosphate  12 mg Intramuscular Q24 Hr x 2  . docusate sodium  100 mg Oral Daily  . prenatal multivitamin  1 tablet Oral Q1200    I have reviewed the patient's current medications.  ASSESSMENT: Patient Active Problem List   Diagnosis Date Noted  . Preeclampsia, severe, third trimester 09/17/2019    PLAN: D/w Dr. Feliberto Gottron - will wait for 1000 lab results and u/s results.  Cyril Mourning, CNM 09/18/2019 12:22 PM

## 2019-09-19 ENCOUNTER — Inpatient Hospital Stay: Payer: Medicaid Other | Admitting: Anesthesiology

## 2019-09-19 ENCOUNTER — Encounter
Admission: EM | Disposition: A | Discharge status: Home / Self Care | Payer: Self-pay | Source: Home / Self Care | Attending: Certified Nurse Midwife

## 2019-09-19 ENCOUNTER — Encounter: Payer: Self-pay | Admitting: Obstetrics and Gynecology

## 2019-09-19 LAB — COMPREHENSIVE METABOLIC PANEL
ALT: 37 U/L (ref 0–44)
ALT: 36 U/L (ref 0–44)
AST: 41 U/L (ref 15–41)
AST: 46 U/L — ABNORMAL HIGH (ref 15–41)
Albumin: 2.4 g/dL — ABNORMAL LOW (ref 3.5–5.0)
Albumin: 2.2 g/dL — ABNORMAL LOW (ref 3.5–5.0)
Alkaline Phosphatase: 185 U/L — ABNORMAL HIGH (ref 38–126)
Alkaline Phosphatase: 167 U/L — ABNORMAL HIGH (ref 38–126)
Anion gap: 11 (ref 5–15)
Anion gap: 12 (ref 5–15)
BUN: 16 mg/dL (ref 6–20)
BUN: 19 mg/dL (ref 6–20)
CO2: 17 mmol/L — ABNORMAL LOW (ref 22–32)
CO2: 18 mmol/L — ABNORMAL LOW (ref 22–32)
Calcium: 7 mg/dL — ABNORMAL LOW (ref 8.9–10.3)
Calcium: 6.5 mg/dL — ABNORMAL LOW (ref 8.9–10.3)
Chloride: 101 mmol/L (ref 98–111)
Chloride: 98 mmol/L (ref 98–111)
Creatinine, Ser: 0.97 mg/dL (ref 0.44–1.00)
Creatinine, Ser: 1.03 mg/dL — ABNORMAL HIGH (ref 0.44–1.00)
GFR calc Af Amer: >60 mL/min (ref >60)
GFR calc Af Amer: >60 mL/min (ref >60)
GFR calc non Af Amer: >60 mL/min (ref >60)
GFR calc non Af Amer: >60 mL/min (ref >60)
Glucose, Bld: 128 mg/dL — ABNORMAL HIGH (ref 70–99)
Glucose, Bld: 151 mg/dL — ABNORMAL HIGH (ref 70–99)
Potassium: 4.5 mmol/L (ref 3.5–5.1)
Potassium: 4.1 mmol/L (ref 3.5–5.1)
Sodium: 129 mmol/L — ABNORMAL LOW (ref 135–145)
Sodium: 128 mmol/L — ABNORMAL LOW (ref 135–145)
Total Bilirubin: 0.5 mg/dL (ref 0.3–1.2)
Total Bilirubin: 0.7 mg/dL (ref 0.3–1.2)
Total Protein: 5.4 g/dL — ABNORMAL LOW (ref 6.5–8.1)
Total Protein: 5.1 g/dL — ABNORMAL LOW (ref 6.5–8.1)

## 2019-09-19 LAB — CBC
HCT: 40.2 % (ref 36.0–46.0)
Hemoglobin: 14.7 g/dL (ref 12.0–15.0)
MCH: 30 pg (ref 26.0–34.0)
MCHC: 36.6 g/dL — ABNORMAL HIGH (ref 30.0–36.0)
MCV: 82 fL (ref 80.0–100.0)
Platelets: 150 10*3/uL (ref 150–400)
RBC: 4.9 MIL/uL (ref 3.87–5.11)
RDW: 13.6 % (ref 11.5–15.5)
WBC: 15.1 10*3/uL — ABNORMAL HIGH (ref 4.0–10.5)
nRBC: 0 % (ref 0.0–0.2)

## 2019-09-19 SURGERY — Surgical Case
Anesthesia: Spinal

## 2019-09-19 MED ORDER — FERROUS SULFATE 325 (65 FE) MG PO TABS
325.0000 mg | ORAL_TABLET | Freq: Two times a day (BID) | ORAL | Status: DC
  Administered 2019-09-20 – 2019-09-24 (×9): 325 mg via ORAL
  Filled 2019-09-19 (×9): qty 1

## 2019-09-19 MED ORDER — SOD CITRATE-CITRIC ACID 500-334 MG/5ML PO SOLN
ORAL | Status: AC
  Filled 2019-09-19: qty 15

## 2019-09-19 MED ORDER — WITCH HAZEL-GLYCERIN EX PADS: 1.0000 "application " | MEDICATED_PAD | CUTANEOUS | Status: DC | PRN

## 2019-09-19 MED ORDER — IBUPROFEN 600 MG PO TABS
600.0000 mg | ORAL_TABLET | Freq: Four times a day (QID) | ORAL | Status: DC
  Administered 2019-09-19 – 2019-09-23 (×17): 600 mg via ORAL
  Filled 2019-09-19 (×18): qty 1

## 2019-09-19 MED ORDER — OXYTOCIN-SODIUM CHLORIDE 30-0.9 UT/500ML-% IV SOLN
INTRAVENOUS | Status: AC
  Filled 2019-09-19: qty 500

## 2019-09-19 MED ORDER — BUPIVACAINE LIPOSOME 1.3 % IJ SUSP: 20.0000 mL | Freq: Once | INTRAMUSCULAR | Status: DC

## 2019-09-19 MED ORDER — ZOLPIDEM TARTRATE 5 MG PO TABS: 5.0000 mg | ORAL_TABLET | Freq: Every evening | ORAL | Status: DC | PRN

## 2019-09-19 MED ORDER — ACETAMINOPHEN 325 MG PO TABS: 650.0000 mg | ORAL_TABLET | ORAL | Status: DC | PRN

## 2019-09-19 MED ORDER — SIMETHICONE 80 MG PO CHEW: 80.0000 mg | CHEWABLE_TABLET | ORAL | Status: DC | PRN

## 2019-09-19 MED ORDER — BENZOCAINE-MENTHOL 20-0.5 % EX AERO
1.0000 "application " | INHALATION_SPRAY | CUTANEOUS | Status: DC | PRN
  Administered 2019-09-19: 1 via TOPICAL
  Filled 2019-09-19: qty 56

## 2019-09-19 MED ORDER — MAGNESIUM SULFATE 40 GM/1000ML IV SOLN
1.0000 g/h | INTRAVENOUS | Status: DC
  Administered 2019-09-19: 1 g/h via INTRAVENOUS

## 2019-09-19 MED ORDER — SIMETHICONE 80 MG PO CHEW
CHEWABLE_TABLET | ORAL | Status: AC
  Filled 2019-09-19: qty 1

## 2019-09-19 MED ORDER — MEASLES, MUMPS & RUBELLA VAC IJ SOLR
0.5000 mL | Freq: Once | INTRAMUSCULAR | Status: DC
  Filled 2019-09-19: qty 0.5

## 2019-09-19 MED ORDER — FENTANYL CITRATE (PF) 100 MCG/2ML IJ SOLN
INTRAMUSCULAR | Status: AC
  Filled 2019-09-19: qty 2

## 2019-09-19 MED ORDER — PROPOFOL 10 MG/ML IV BOLUS
INTRAVENOUS | Status: AC
  Filled 2019-09-19: qty 20

## 2019-09-19 MED ORDER — LIDOCAINE HCL (PF) 2 % IJ SOLN
INTRAMUSCULAR | Status: AC
  Filled 2019-09-19: qty 5

## 2019-09-19 MED ORDER — SENNOSIDES-DOCUSATE SODIUM 8.6-50 MG PO TABS
2.0000 | ORAL_TABLET | ORAL | Status: DC
  Administered 2019-09-21 – 2019-09-24 (×3): 2 via ORAL
  Filled 2019-09-19 (×4): qty 2

## 2019-09-19 MED ORDER — BUPIVACAINE HCL (PF) 0.5 % IJ SOLN
INTRAMUSCULAR | Status: AC
  Filled 2019-09-19: qty 30

## 2019-09-19 MED ORDER — DIBUCAINE (PERIANAL) 1 % EX OINT: 1.0000 "application " | TOPICAL_OINTMENT | CUTANEOUS | Status: DC | PRN

## 2019-09-19 MED ORDER — SODIUM CHLORIDE (PF) 0.9 % IJ SOLN
INTRAMUSCULAR | Status: AC
  Filled 2019-09-19: qty 50

## 2019-09-19 MED ORDER — MAGNESIUM HYDROXIDE 400 MG/5ML PO SUSP
30.0000 mL | ORAL | Status: DC | PRN
  Filled 2019-09-19: qty 30

## 2019-09-19 MED ORDER — LACTATED RINGERS IV SOLN: INTRAVENOUS | Status: DC

## 2019-09-19 MED ORDER — PRENATAL MULTIVITAMIN CH
1.0000 | ORAL_TABLET | Freq: Every day | ORAL | Status: DC
  Administered 2019-09-20 – 2019-09-24 (×5): 1 via ORAL
  Filled 2019-09-19 (×5): qty 1

## 2019-09-19 MED ORDER — CEFAZOLIN SODIUM-DEXTROSE 2-4 GM/100ML-% IV SOLN
2.0000 g | Freq: Once | INTRAVENOUS | Status: DC
  Filled 2019-09-19: qty 100

## 2019-09-19 MED ORDER — SODIUM CHLORIDE 0.9 % IV SOLN: INTRAVENOUS | Status: DC

## 2019-09-19 MED ORDER — DIPHENHYDRAMINE HCL 25 MG PO CAPS: 25.0000 mg | ORAL_CAPSULE | Freq: Four times a day (QID) | ORAL | Status: DC | PRN

## 2019-09-19 MED ORDER — ONDANSETRON HCL 4 MG PO TABS
4.0000 mg | ORAL_TABLET | ORAL | Status: DC | PRN
  Filled 2019-09-19: qty 1

## 2019-09-19 MED ORDER — BUPIVACAINE LIPOSOME 1.3 % IJ SUSP
INTRAMUSCULAR | Status: AC
  Filled 2019-09-19: qty 20

## 2019-09-19 MED ORDER — SODIUM CHLORIDE 0.9 % IV SOLN
500.0000 mg | INTRAVENOUS | Status: DC
  Filled 2019-09-19: qty 500

## 2019-09-19 MED ORDER — COCONUT OIL OIL
1.0000 "application " | TOPICAL_OIL | Status: DC | PRN
  Administered 2019-09-21: 1 via TOPICAL
  Filled 2019-09-19: qty 120

## 2019-09-19 MED ORDER — OXYTOCIN-SODIUM CHLORIDE 30-0.9 UT/500ML-% IV SOLN
INTRAVENOUS | Status: DC | PRN
  Administered 2019-09-19: 600 mL/h via INTRAVENOUS

## 2019-09-19 MED ORDER — ONDANSETRON HCL 4 MG/2ML IJ SOLN: 4.0000 mg | INTRAMUSCULAR | Status: DC | PRN

## 2019-09-19 MED ORDER — OXYTOCIN-SODIUM CHLORIDE 30-0.9 UT/500ML-% IV SOLN: 2.5000 [IU]/h | INTRAVENOUS | Status: DC

## 2019-09-19 SURGICAL SUPPLY — 24 items
BARRIER ADHS 3X4 INTERCEED (GAUZE/BANDAGES/DRESSINGS) IMPLANT
CANISTER SUCT 3000ML PPV (MISCELLANEOUS) IMPLANT
CHLORAPREP W/TINT 26 (MISCELLANEOUS) IMPLANT
COVER WAND RF STERILE (DRAPES) IMPLANT
DRSG TELFA 3X8 NADH (GAUZE/BANDAGES/DRESSINGS) IMPLANT
ELECT CAUTERY BLADE 6.4 (BLADE) IMPLANT
ELECT REM PT RETURN 9FT ADLT (ELECTROSURGICAL)
ELECTRODE REM PT RTRN 9FT ADLT (ELECTROSURGICAL) IMPLANT
GAUZE SPONGE 4X4 12PLY STRL (GAUZE/BANDAGES/DRESSINGS) IMPLANT
GLOVE SURG SYN 8.0 (GLOVE) IMPLANT
GOWN STRL REUS W/ TWL LRG LVL3 (GOWN DISPOSABLE) IMPLANT
GOWN STRL REUS W/ TWL XL LVL3 (GOWN DISPOSABLE) IMPLANT
GOWN STRL REUS W/TWL LRG LVL3 (GOWN DISPOSABLE)
GOWN STRL REUS W/TWL XL LVL3 (GOWN DISPOSABLE)
NEEDLE HYPO 22GX1.5 SAFETY (NEEDLE) IMPLANT
NS IRRIG 1000ML POUR BTL (IV SOLUTION) IMPLANT
PACK C SECTION (MISCELLANEOUS) IMPLANT
PAD OB MATERNITY 4.3X12.25 (PERSONAL CARE ITEMS) IMPLANT
PAD PREP 24X41 OB/GYN DISP (PERSONAL CARE ITEMS) IMPLANT
STRAP SAFETY 5IN WIDE (MISCELLANEOUS) IMPLANT
SUT CHROMIC 1 CTX 36 (SUTURE) IMPLANT
SUT PLAIN GUT 0 (SUTURE) IMPLANT
SUT VIC AB 0 CT1 36 (SUTURE) IMPLANT
SYR 30ML LL (SYRINGE) IMPLANT

## 2019-09-19 NOTE — Transfer of Care (Signed)
Immediate Anesthesia Transfer of Care Note  Patient: Jaime Potts  Procedure(s) Performed: CESAREAN SECTION  Patient Location: Mother/Baby  Anesthesia Type:MAC  Level of Consciousness: alert  and oriented  Airway & Oxygen Therapy: Patient Spontanous Breathing  Post-op Assessment: Report given to RN and Post -op Vital signs reviewed and stable  Post vital signs: Reviewed and stable  Last Vitals:  Vitals Value Taken Time  BP 134/66 09/19/19 0622  Temp    Pulse    Resp 20 09/19/19 0622  SpO2      Last Pain:  Vitals:   09/18/19 2333  TempSrc: Oral  PainSc:          Complications: No complications documented.

## 2019-09-19 NOTE — Progress Notes (Signed)
Pt taken back to OR emergently once the stat c/s called . Placement of mother on OR table and then noted the baby had delivered and was between her legs . Ashen colored . Tight nuchal cord x 2 reduced . Floppy female passed to nursery staff . APGARS 2/ 8. Cord gas obtained = pending

## 2019-09-19 NOTE — Progress Notes (Signed)
Patient ID: Jaime Potts, female   DOB: [birth year], 39 y.o.   MRN: [id number] Further deep decelerations - call c/s stat now

## 2019-09-19 NOTE — Progress Notes (Signed)
Post Partum Day 0  Subjective: Doing well, no concerns. Pain managed with PO meds, and voiding through foley.   No fever/chills, chest pain, shortness of breath, nausea/vomiting, or leg pain. No nipple or breast pain. No headache, visual changes, or RUQ/epigastric pain.  Objective: BP (!) 165/77   Pulse 87   Temp (!) 97.2 F (36.2 C) (Oral)   Resp 20   Ht 5' (1.524 m)   Wt 87.4 kg   LMP 02/05/2019 (Exact Date)   SpO2 95%   Breastfeeding Unknown   BMI 37.63 kg/m    Vitals with BMI 09/19/2019 09/19/2019 09/19/2019  Height - - -  Weight - - -  BMI - - -  Systolic 165 145 160  Diastolic 77 67 67  Pulse 87 - -    Physical Exam:  General: alert and cooperative Breasts: soft/nontender CV: RRR Pulm: nl effort, CTABL Abdomen: soft, non-tender, active bowel sounds Uterine Fundus: firm Perineum: minimal edema Lochia: appropriate DVT Evaluation: No evidence of DVT seen on physical exam. Reflex +3, no clonus [place]: No flowsheet data found.   Recent Labs    09/18/19 0946 09/19/19 0411  HGB 14.3 14.7  HCT 40.6 40.2  WBC 9.0 15.1*  PLT 142* 150    Assessment/Plan: 39 y.o. [id number] postpartum day # 0  -Continue Mag Sulfate and conditional orders for antihypertensives -Continue routine postpartum care -Lactation consult PRN for breastfeeding/pumping, baby in NICU   Disposition: Continue inpatient postpartum care    LOS: 2 days   Yafet Cline, CNM 09/19/2019, 9:07 AM

## 2019-09-19 NOTE — Consult Note (Signed)
Reason for Consult: Prematurity, IOL Referring Physician: Dr. Feliberto Gottron  Name: Jaime Potts Birth: January 25, 1980  Admit: 09/17/2019  6:59 PM  I had the pleasure of speaking with Ms. Jaime Potts with the aid of the Spanish interpreter about the impending delivery of her son Jaime Potts at [redacted] weeks gestation. She will be induced due to severe pre-eclampsia. We discussed initial delivery room care and the fact that Jaime Potts might need some help with his breathing at the time of birth. We discussed Jaime Potts's need for respiratory and nutritional support as well as thermoregulation. She plans to provide breast milk for her baby and I encouraged her efforts, especially in light of infant's gestational age. We discussed the average length of stay for a 32 week infant and the expectation that Jaime Potts would be hospitalized for multiple weeks. I repeated this information with the Jaime Potts later this evening. Provided reassurance that Jaime Potts would be well cared for in the [facility] and encouraged their visitation and participation in his care. Parents were both given the opportunity to ask questions. Thank you for this consult.

## 2019-09-19 NOTE — Progress Notes (Signed)
Dr. Feliberto Gottron at bedside. Verbal order to change Magnesium dose, 1 gram for 3 hours.

## 2019-09-19 NOTE — Progress Notes (Signed)
Patient ID: Jaime Potts, female   DOB: 1980/11/21, 39 y.o.   MRN: [id number] nonreasurring fetal monitoring with decreased variability and late decels . AROM bloody . Possible abruption . Proceed to LTCS . internet interpreter with counseling regarding the procedure and the reasoning . Risks explained .  All questions answered . Pt declines BTL

## 2019-09-19 NOTE — Anesthesia Postprocedure Evaluation (Signed)
Anesthesia Post Note  Patient: Jaime Potts  Procedure(s) Performed: [place]  Patient location during evaluation: L&D Anesthesia Type: MAC Level of consciousness: oriented and awake and alert Pain management: pain level controlled Vital Signs Assessment: post-procedure vital signs reviewed and stable Respiratory status: spontaneous breathing and respiratory function stable Cardiovascular status: blood pressure returned to baseline and stable Postop Assessment: no headache, no backache, no apparent nausea or vomiting and able to ambulate Anesthetic complications: no   No complications documented.   Last Vitals:  Vitals:   09/19/19 0705 09/19/19 0721  BP: (!) 171/82 (!) 159/69  Pulse: 72 75  Resp:    Temp:  (!) 36.2 C  SpO2: 96% 98%    Last Pain:  Vitals:   09/19/19 0725  TempSrc:   PainSc: 0-No pain                 Alison Stalling

## 2019-09-19 NOTE — Anesthesia Preprocedure Evaluation (Addendum)
Anesthesia Evaluation  Patient identified by MRN, date of birth, ID band Patient awake    Reviewed: Allergy & Precautions, H&P , NPO status , Patient's Chart, lab work & pertinent test results, reviewed documented beta blocker date and time   Airway Mallampati: IV  TM Distance: <3 FB Neck ROM: full  Mouth opening: Limited Mouth Opening  Dental no notable dental hx. (+) Teeth Intact   Pulmonary neg pulmonary ROS, Current Smoker,    Pulmonary exam normal breath sounds clear to auscultation       Cardiovascular Exercise Tolerance: Good hypertension, On Medications negative cardio ROS   Rhythm:regular Rate:Normal     Neuro/Psych negative neurological ROS  negative psych ROS   GI/Hepatic negative GI ROS, Neg liver ROS,   Endo/Other  negative endocrine ROSdiabetes, Gestational  Renal/GU      Musculoskeletal   Abdominal   Peds  Hematology negative hematology ROS (+)   Anesthesia Other Findings   Reproductive/Obstetrics (+) Pregnancy                             Anesthesia Physical Anesthesia Plan  ASA: III and emergent  Anesthesia Plan: Spinal   Post-op Pain Management:    Induction:   PONV Risk Score and Plan:   Airway Management Planned:   Additional Equipment:   Intra-op Plan:   Post-operative Plan:   Informed Consent: I have reviewed the patients History and Physical, chart, labs and discussed the procedure including the risks, benefits and alternatives for the proposed anesthesia with the patient or authorized representative who has indicated his/her understanding and acceptance.       Plan Discussed with:   Anesthesia Plan Comments: (History per interpreter on line.  Risks and benefits discussed with patient and accepted via interpreter assistance/ stratus video.  Marland Kitchen JA)       Anesthesia Quick Evaluation

## 2019-09-19 NOTE — Discharge Summary (Signed)
Obstetrical Discharge Summary  Patient Name: Jaime Potts DOB: [birth year] MRN: [id number]  Date of Admission: 09/17/2019 Date of Delivery: 09/19/19 Delivered by: Beverly Gust MD Date of Discharge: 09/24/2019 Primary OB:  Phineas Real  GYJ:[id number]'F last menstrual period was 02/05/2019 (exact date). EDC Estimated Date of Delivery: 11/08/19 Gestational Age at Delivery: [redacted]w[redacted]d   Antepartum complications: severe preeclampsia Admitting Diagnosis: severe PIH Secondary Diagnosis: SVD Patient Active Problem List   Diagnosis Date Noted  . Preeclampsia, severe, third trimester 09/17/2019    Augmentation: AROM, cytotec Complications: None Intrapartum complications/course: nonreassuring fetal monitoring stat c/s called and baby delivered with patient movement to OR table . Nursery resuscitation APGARS 2/8. PTD 32+6 weeks  Cord gas 7.15 / co2 67/ bicar 23 Date of Delivery: 09/24/2019  Delivered By: Beverly Gust MD Delivery Type: spontaneous vaginal delivery Anesthesia: none Placenta: spontaneous Laceration: none Episiotomy: none Newborn Data:female APGARS 2/8 .   Postpartum Procedures: procardia 90mg  XL daily, Labetalol titrated to 500mg  PO BID, added HCTZ 25mg  PO daily on 09/24/19 with improvement of BP to normal range.   Edinburgh:  Edinburgh Postnatal Depression Scale Screening Tool 09/22/2019  I have been able to laugh and see the funny side of things. 1  I have looked forward with enjoyment to things. 0  I have blamed myself unnecessarily when things went wrong. 0  I have been anxious or worried for no good reason. 0  I have felt scared or panicky for no good reason. 0  Things have been getting on top of me. 0  I have been so unhappy that I have had difficulty sleeping. 0  I have felt sad or miserable. 0  I have been so unhappy that I have been crying. 0  The thought of harming myself has occurred to me. 0  Edinburgh Postnatal Depression Scale Total 1    Post partum  course:  Patient had a postpartum course complicated by significant difficulty controlling BP postpartum.  By time of discharge on PPD#5, her pain was controlled on oral pain medications; she had appropriate lochia and was ambulating, voiding without difficulty and tolerating regular diet. BP was controlled with multiple agents.  She was deemed stable for discharge to home.     Discharge Physical Exam:  BP 134/70 (BP Location: Right Arm)   Pulse 70   Temp 98.6 F (37 C) (Oral)   Resp 18   Ht 5' (1.524 m)   Wt 87.4 kg   LMP 02/05/2019 (Exact Date)   SpO2 100%   Breastfeeding Unknown   BMI 37.63 kg/m   General: NAD CV: RRR Pulm: CTABL, nl effort ABD: s/nd/nt, fundus firm and below the umbilicus Lochia: moderate DVT Evaluation: LE non-ttp, no evidence of DVT on exam.  Hemoglobin  Date Value Ref Range Status  09/23/2019 13.0 12.0 - 15.0 g/dL Final   HCT  Date Value Ref Range Status  09/23/2019 36.5 36 - 46 % Final     Disposition: stable, discharge to home. Baby Feeding: breastmilk and formula Baby Disposition: SCN- prematurity.   Rh Immune globulin given: n/a Rubella vaccine given: immune Tdap vaccine given in AP or PP setting: 08/20/19 Flu vaccine given in AP or PP setting: offer prior to DC  Contraception: husband planning vasectomy   Prenatal Labs:  Blood type/Rh --/--/O POS (09/07 1953)  Antibody screen neg  Rubella Immune  Varicella Immune  RPR NR  HBsAg Neg  HIV NR  GC neg  Chlamydia neg  Genetic screening negative  1 hour  GTT elevated  3 hour GTT normal  GBS neg      Plan:  Chia Mowers was discharged to home in good condition. Follow-up appointment with delivering provider in 3 days for BP followup  Discharge Medications: Allergies as of 09/24/2019   No Known Allergies     Medication List    TAKE these medications   acetaminophen 500 MG tablet Commonly known as: TYLENOL Take 500 mg by mouth every 6 (six) hours as needed.    ferrous sulfate 325 (65 FE) MG tablet Take 1 tablet (325 mg total) by mouth 2 (two) times daily with a meal.   hydrochlorothiazide 25 MG tablet Commonly known as: HYDRODIURIL Take 1 tablet (25 mg total) by mouth daily. Start taking on: September 25, 2019   ibuprofen 600 MG tablet Commonly known as: ADVIL Take 1 tablet (600 mg total) by mouth every 6 (six) hours.   labetalol 100 MG tablet Commonly known as: NORMODYNE Take 5 tablets (500 mg total) by mouth 2 (two) times daily for 10 days.   NIFEdipine 90 MG 24 hr tablet Commonly known as: PROCARDIA XL/NIFEDICAL-XL Take 1 tablet (90 mg total) by mouth daily. Start taking on: September 25, 2019   prenatal vitamin w/FE, FA 27-1 MG Tabs tablet Take 1 tablet by mouth daily at 12 noon.   senna-docusate 8.6-50 MG tablet Commonly known as: Senokot-S Take 2 tablets by mouth daily. Start taking on: September 25, 2019        Follow-up Information    Schermerhorn, Ihor [place], MD. Schedule an appointment as soon as possible for a visit in 6 week(s).   Specialty: Obstetrics and Gynecology Why: postpartum visit  Contact information: [address] [place] [address] [phone]        [facility]. Go on 09/27/2019.   Why: Blood Pressure check with Dr. Feliberto Gottron at [facility] information: [address] [place] [address] [phone]              Signed: Randa Ngo, CNM 09/24/2019 3:03 PM

## 2019-09-19 NOTE — Progress Notes (Signed)
Pt. Transferred to SCN to visit with baby after BP of 140/68. Pt. alert and oriented.

## 2019-09-20 LAB — COMPREHENSIVE METABOLIC PANEL
ALT: 31 U/L (ref 0–44)
AST: 38 U/L (ref 15–41)
Albumin: 2.1 g/dL — ABNORMAL LOW (ref 3.5–5.0)
Alkaline Phosphatase: 135 U/L — ABNORMAL HIGH (ref 38–126)
Anion gap: 8 (ref 5–15)
BUN: 19 mg/dL (ref 6–20)
CO2: 22 mmol/L (ref 22–32)
Calcium: 6.8 mg/dL — ABNORMAL LOW (ref 8.9–10.3)
Chloride: 107 mmol/L (ref 98–111)
Creatinine, Ser: 0.9 mg/dL (ref 0.44–1.00)
GFR calc Af Amer: >60 mL/min (ref >60)
GFR calc non Af Amer: >60 mL/min (ref >60)
Glucose, Bld: 106 mg/dL — ABNORMAL HIGH (ref 70–99)
Potassium: 4.1 mmol/L (ref 3.5–5.1)
Sodium: 137 mmol/L (ref 135–145)
Total Bilirubin: 0.5 mg/dL (ref 0.3–1.2)
Total Protein: 4.5 g/dL — ABNORMAL LOW (ref 6.5–8.1)

## 2019-09-20 LAB — CBC
HCT: 40.6 % (ref 36.0–46.0)
Hemoglobin: 14 g/dL (ref 12.0–15.0)
MCH: 30.2 pg (ref 26.0–34.0)
MCHC: 34.5 g/dL (ref 30.0–36.0)
MCV: 87.7 fL (ref 80.0–100.0)
Platelets: 155 10*3/uL (ref 150–400)
RBC: 4.63 MIL/uL (ref 3.87–5.11)
RDW: 14.1 % (ref 11.5–15.5)
WBC: 13.1 10*3/uL — ABNORMAL HIGH (ref 4.0–10.5)
nRBC: 0 % (ref 0.0–0.2)

## 2019-09-20 LAB — SURGICAL PATHOLOGY

## 2019-09-20 LAB — MAGNESIUM: Magnesium: 7.9 mg/dL (ref 1.7–2.4)

## 2019-09-20 MED ORDER — NIFEDIPINE ER OSMOTIC RELEASE 30 MG PO TB24
60.0000 mg | ORAL_TABLET | Freq: Every day | ORAL | Status: DC
  Administered 2019-09-20: 60 mg via ORAL
  Filled 2019-09-20: qty 2

## 2019-09-20 NOTE — Progress Notes (Signed)
Georgeanna Harrison NNP in to discuss plan of care for infant with patient and her husband. Spanish interpreter, [employer], [phone] interpreted.

## 2019-09-20 NOTE — Lactation Note (Addendum)
Spanish interpreter Beecher Mcardle present to interpret, I instructed mom in frequency and length of pumping, informed about pumping after discharge and obtaining an electric pump through [facility] after discharge, will loan mom a breast pump until she can obtain one from [facility], mom pumped breasts in initiation mode and obtained 8cc colostrum, this was labeled and taken to SCN and given to [employer].  I faxed and called WIC in [place] WIC referral form.

## 2019-09-20 NOTE — Progress Notes (Signed)
Post Partum Day 1 - Interpreter used for communication   Subjective: no complaints, up ad lib, voiding and tolerating PO  Doing well, no concerns. Ambulating without difficulty, pain managed with PO meds, tolerating regular diet, and voiding without difficulty.   No fever/chills, chest pain, shortness of breath, nausea/vomiting, or leg pain. No nipple or breast pain. No headache, visual changes, or RUQ/epigastric pain.  Objective: BP (!) 121/92 (BP Location: Right Arm)   Pulse 71   Temp 98.1 F (36.7 C) (Oral)   Resp 18   Ht 5' (1.524 m)   Wt 87.4 kg   LMP 02/05/2019 (Exact Date)   SpO2 98%   Breastfeeding Unknown   BMI 37.63 kg/m    Physical Exam:  General: alert, cooperative and no distress Breasts: soft/nontender CV: RRR Pulm: nl effort, CTABL Abdomen: soft, non-tender, active bowel sounds Uterine Fundus: firm Perineum: minimal edema, intact Lochia: appropriate DVT Evaluation: No evidence of DVT seen on physical exam.  Recent Labs    09/19/19 0411 09/20/19 0636  HGB 14.7 14.0  HCT 40.2 40.6  WBC 15.1* 13.1*  PLT 150 155   CMP Latest Ref Rng & Units 09/20/2019 09/19/2019 09/19/2019  Glucose 70 - 99 mg/dL 657(Q) 469(G) 295(M)  BUN 6 - 20 mg/dL 19 19 16   Creatinine 0.44 - 1.00 mg/dL 8.41) 3.24(M  Sodium 135 - 145 mmol/L 137 128(L) 129(L)  Potassium 3.5 - 5.1 mmol/L 4.1 4.1 4.5  Chloride 98 - 111 mmol/L 107 98 101  CO2 22 - 32 mmol/L 22 18(L) 17(L)  Calcium 8.9 - 10.3 mg/dL 0.10) 6.5(L) 7.0(L)  Total Protein 6.5 - 8.1 g/dL 2.7(O) 5.1(L) 5.4(L)  Total Bilirubin 0.3 - 1.2 mg/dL 0.5 0.7 0.5  Alkaline Phos 38 - 126 U/L 135(H) 167(H) 185(H)  AST 15 - 41 U/L 38 46(H) 41  ALT 0 - 44 U/L 31 36 37    Intake/Output Summary (Last 24 hours) at 09/20/2019 1957 Last data filed at 09/20/2019 1032 Gross per 24 hour  Intake 1900.93 ml  Output 3560 ml  Net -1659.07 ml    Assessment/Plan: 39 y.o. 24 postpartum day # 1  -s/p magnesium sulfate infusion - d/c this  morning  -treated several times overnight with IV labetalol and IV hydralazine  -PO procardia 60mg  started this morning -> b/p improved  -Continue routine postpartum care -Lactation consult PRN for breastfeeding/pumping  -Immunization status: all immunizations up to date  Disposition: Continue inpatient postpartum care    LOS: 3 days   [id number], CNM 09/20/2019, 7:56 PM   ----- Gustavo Lah  Certified Nurse Midwife [facility]

## 2019-09-21 LAB — COMPREHENSIVE METABOLIC PANEL
ALT: 57 U/L — ABNORMAL HIGH (ref 0–44)
AST: 57 U/L — ABNORMAL HIGH (ref 15–41)
Albumin: 2.3 g/dL — ABNORMAL LOW (ref 3.5–5.0)
Alkaline Phosphatase: 125 U/L (ref 38–126)
Anion gap: 8 (ref 5–15)
BUN: 19 mg/dL (ref 6–20)
CO2: 24 mmol/L (ref 22–32)
Calcium: 7.6 mg/dL — ABNORMAL LOW (ref 8.9–10.3)
Chloride: 106 mmol/L (ref 98–111)
Creatinine, Ser: 0.85 mg/dL (ref 0.44–1.00)
GFR calc Af Amer: >60 mL/min (ref >60)
GFR calc non Af Amer: >60 mL/min (ref >60)
Glucose, Bld: 87 mg/dL (ref 70–99)
Potassium: 3.9 mmol/L (ref 3.5–5.1)
Sodium: 138 mmol/L (ref 135–145)
Total Bilirubin: 0.5 mg/dL (ref 0.3–1.2)
Total Protein: 5 g/dL — ABNORMAL LOW (ref 6.5–8.1)

## 2019-09-21 LAB — CBC
HCT: 39.1 % (ref 36.0–46.0)
Hemoglobin: 13 g/dL (ref 12.0–15.0)
MCH: 29.9 pg (ref 26.0–34.0)
MCHC: 33.2 g/dL (ref 30.0–36.0)
MCV: 89.9 fL (ref 80.0–100.0)
Platelets: 163 10*3/uL (ref 150–400)
RBC: 4.35 MIL/uL (ref 3.87–5.11)
RDW: 13.9 % (ref 11.5–15.5)
WBC: 11.3 10*3/uL — ABNORMAL HIGH (ref 4.0–10.5)
nRBC: 0 % (ref 0.0–0.2)

## 2019-09-21 MED ORDER — NIFEDIPINE ER OSMOTIC RELEASE 30 MG PO TB24
90.0000 mg | ORAL_TABLET | Freq: Every day | ORAL | Status: DC
  Administered 2019-09-21 – 2019-09-24 (×4): 90 mg via ORAL
  Filled 2019-09-21 (×5): qty 3

## 2019-09-21 MED ORDER — LABETALOL HCL 200 MG PO TABS
200.0000 mg | ORAL_TABLET | Freq: Once | ORAL | Status: AC
  Administered 2019-09-22: 200 mg via ORAL
  Filled 2019-09-21: qty 1

## 2019-09-21 MED ORDER — LABETALOL HCL 100 MG PO TABS
100.0000 mg | ORAL_TABLET | Freq: Two times a day (BID) | ORAL | Status: DC
  Administered 2019-09-21: 100 mg via ORAL
  Filled 2019-09-21: qty 1

## 2019-09-21 MED ORDER — LABETALOL HCL 200 MG PO TABS
200.0000 mg | ORAL_TABLET | Freq: Two times a day (BID) | ORAL | Status: DC
  Administered 2019-09-22: 200 mg via ORAL
  Filled 2019-09-21: qty 1

## 2019-09-21 NOTE — Progress Notes (Signed)
Post Partum Day 2  Subjective: Doing well, no concerns. Ambulating without difficulty, pain managed with PO meds, tolerating regular diet, and voiding without difficulty.   No fever/chills, chest pain, shortness of breath, nausea/vomiting, or leg pain. No nipple or breast pain. No headache, visual changes, or RUQ/epigastric pain.  Objective: BP (!) 151/81   Pulse 75   Temp 98.1 F (36.7 C) (Oral)   Resp 18   Ht 5' (1.524 m)   Wt 87.4 kg   LMP 02/05/2019 (Exact Date)   SpO2 99%   Breastfeeding Unknown   BMI 37.63 kg/m    Physical Exam:  General: alert, cooperative, appears stated age and no distress Breasts: soft/nontender CV: RRR Pulm: nl effort, CTABL Abdomen: soft, non-tender, active bowel sounds Uterine Fundus: firm Lochia: appropriate DVT Evaluation: No evidence of DVT seen on physical exam. No cords or calf tenderness.  Recent Labs    09/19/19 0411 09/20/19 0636  HGB 14.7 14.0  HCT 40.2 40.6  WBC 15.1* 13.1*  PLT 150 155    Assessment/Plan: 39 y.o. [id number] postpartum day # 2  -Continue routine postpartum care -Lactation consult PRN for pumping -Discussed contraceptive options including implant, IUDs hormonal and non-hormonal, injection, pills/ring/patch, condoms, and NFP.  -Mild to severe range BPs. IV labetalol given today with resolution of severe range blood pressures. Increased Procardia XL to 90mg /day. If elevated blood pressures persist, will add labetalol PO BID. Dr. updated.   Disposition: Continue inpatient postpartum care   LOS: 4 days   Feliberto Gottron, CNM 09/21/2019, 11:17 AM   ----- 11/21/2019 Certified Nurse Midwife [facility]

## 2019-09-21 NOTE — Lactation Note (Signed)
This note was copied from a baby's chart. Lactation Consultation Note  Patient Name: Jaime Potts Date: 09/21/2019 Reason for consult: Follow-up assessment;NICU baby;Preterm <34wks;Infant < 6lbs;Other (Comment) (Assisted with pumping)  Assisted mom with pumping with Symphony pump using #27 flanges this time.  Coconut given with instructions to rub around flange of pump to get a better seal and for comfort.  Mom denies any breast or nipple pain.  Mom only expressed 5 ml at this session and took with her to [facility] when visited him.  She had been pumping larger volumes.  She had not pumped since 10am. Reviewed supply and demand and necessity of pumping 8 or more times in 24 hours or around every 3 hours to bring in mature milk and ensure a plentiful supply for Jaime Potts in SCN.  Alan Mulder was born at 32.6 week only weighing 3 lb 9.5 oz and was transferred to [facility] for prematurity and respiratory distress.  Mom was preeclamptic on MgS04.  Reviewed breast massage, hand expression, pumping, collection, storage, labeling, cleaning and handling of expressed milk.  Praised mom for her commitment to provide breast milk for Jaime Potts.  Referral faxed to [facility] for mom to get DEBP.  Mom reports that they called her yesterday and was told they would have a pump waiting for her upon discharge.  If mom is discharged tomorrow when [facility] is closed, explained to mom that we would rent her a Symphony DEBP through the [facility] until she can get a WIC pump at which time she can return our pump. Lactation name and number written on white board and encouraged to call with any questions, concerns or assistance.     Maternal Data Formula Feeding for Exclusion: No Has patient been taught Hand Expression?: Yes Does the patient have breastfeeding experience prior to this delivery?: Yes  Feeding    LATCH Score                   Interventions Interventions: Breast feeding basics reviewed;Hand express;Expressed  milk;Coconut oil;DEBP  Lactation Tools Discussed/Used Tools: Pump;Flanges;Coconut oil Flange Size: 27 Breast pump type: Double-Electric Breast Pump;Manual WIC Program: Yes Pump Review: Setup, frequency, and cleaning;Milk Storage;Other (comment) Date initiated:: 09/19/19   Consult Status Consult Status: PRN    Louis Meckel 09/21/2019, 5:53 PM

## 2019-09-22 LAB — CBC
HCT: 37.2 % (ref 36.0–46.0)
Hemoglobin: 12.9 g/dL (ref 12.0–15.0)
MCH: 30 pg (ref 26.0–34.0)
MCHC: 34.7 g/dL (ref 30.0–36.0)
MCV: 86.5 fL (ref 80.0–100.0)
Platelets: 158 10*3/uL (ref 150–400)
RBC: 4.3 MIL/uL (ref 3.87–5.11)
RDW: 13.7 % (ref 11.5–15.5)
WBC: 9.8 10*3/uL (ref 4.0–10.5)
nRBC: 0 % (ref 0.0–0.2)

## 2019-09-22 LAB — COMPREHENSIVE METABOLIC PANEL
ALT: 116 U/L — ABNORMAL HIGH (ref 0–44)
AST: 103 U/L — ABNORMAL HIGH (ref 15–41)
Albumin: 2.4 g/dL — ABNORMAL LOW (ref 3.5–5.0)
Alkaline Phosphatase: 105 U/L (ref 38–126)
Anion gap: 10 (ref 5–15)
BUN: 18 mg/dL (ref 6–20)
CO2: 24 mmol/L (ref 22–32)
Calcium: 8.1 mg/dL — ABNORMAL LOW (ref 8.9–10.3)
Chloride: 104 mmol/L (ref 98–111)
Creatinine, Ser: 0.93 mg/dL (ref 0.44–1.00)
GFR calc Af Amer: >60 mL/min (ref >60)
GFR calc non Af Amer: >60 mL/min (ref >60)
Glucose, Bld: 76 mg/dL (ref 70–99)
Potassium: 4.5 mmol/L (ref 3.5–5.1)
Sodium: 138 mmol/L (ref 135–145)
Total Bilirubin: 0.5 mg/dL (ref 0.3–1.2)
Total Protein: 5 g/dL — ABNORMAL LOW (ref 6.5–8.1)

## 2019-09-22 MED ORDER — LABETALOL HCL 200 MG PO TABS
300.0000 mg | ORAL_TABLET | Freq: Two times a day (BID) | ORAL | Status: DC
  Administered 2019-09-22 – 2019-09-23 (×2): 300 mg via ORAL
  Filled 2019-09-22 (×2): qty 1

## 2019-09-22 NOTE — Progress Notes (Signed)
Post Partum Day 3  Subjective: Doing well, no concerns. Ambulating without difficulty, pain managed with PO meds, tolerating regular diet, and voiding without difficulty.   No fever/chills, chest pain, shortness of breath, nausea/vomiting, or leg pain. No nipple or breast pain. No headache, visual changes, or RUQ/epigastric pain.  Objective: BP (!) 156/81 (BP Location: Right Arm)   Pulse 67   Temp 97.8 F (36.6 C) (Oral)   Resp 18   Ht 5' (1.524 m)   Wt 87.4 kg   LMP 02/05/2019 (Exact Date)   SpO2 100%   Breastfeeding Unknown   BMI 37.63 kg/m    Physical Exam:  General: alert, cooperative, appears stated age and no distress Breasts: soft/nontender CV: RRR Pulm: nl effort, CTABL Abdomen: soft, non-tender, active bowel sounds Uterine Fundus: firm Lochia: appropriate DVT Evaluation: No evidence of DVT seen on physical exam. No cords or calf tenderness. No significant calf/ankle edema.  Recent Labs    09/21/19 1132 09/22/19 0733  HGB 13.0 12.9  HCT 39.1 37.2  WBC 11.3* 9.8  PLT 163 158    Assessment/Plan: 39 y.o. [id number] postop day # 3  -Continue routine postpartum care -Lactation consult PRN for pumping -Persistent mild to moderate range blood pressures, despite increase in anti-hypertensives. After discussion with Dr. Feliberto Gottron, will continue on Procardia XL 90mg  daily and increase labetalol to 300mg  PO BID. Liver transaminases elevating, labs otherwise WNL. No other symptoms.   Disposition: Continue inpatient postpartum care   LOS: 5 days   , CNM 09/22/2019, 1:47 PM   ----- Genia Del Certified Nurse Midwife [facility]

## 2019-09-22 NOTE — Lactation Note (Signed)
This note was copied from a baby's chart. Lactation Consultation Note  Patient Name: Boy Derrick Orris [id number] Date: 09/22/2019   Observed mom pumping once today.  Reminded her to continue to pump around every 3 hours or 8 or more times in 24 hours.  Lactation name and number written on white board and encouraged to call with any questions, concerns or assistance.   Maternal Data    Feeding    LATCH Score                   Interventions    Lactation Tools Discussed/Used     Consult Status      Louis Meckel 09/22/2019, 9:41 PM

## 2019-09-23 LAB — CBC
HCT: 36.5 % (ref 36.0–46.0)
Hemoglobin: 13 g/dL (ref 12.0–15.0)
MCH: 30.7 pg (ref 26.0–34.0)
MCHC: 35.6 g/dL (ref 30.0–36.0)
MCV: 86.1 fL (ref 80.0–100.0)
Platelets: 172 10*3/uL (ref 150–400)
RBC: 4.24 MIL/uL (ref 3.87–5.11)
RDW: 13.3 % (ref 11.5–15.5)
WBC: 9.5 10*3/uL (ref 4.0–10.5)
nRBC: 0 % (ref 0.0–0.2)

## 2019-09-23 LAB — COMPREHENSIVE METABOLIC PANEL
ALT: 112 U/L — ABNORMAL HIGH (ref 0–44)
AST: 76 U/L — ABNORMAL HIGH (ref 15–41)
Albumin: 2.4 g/dL — ABNORMAL LOW (ref 3.5–5.0)
Alkaline Phosphatase: 110 U/L (ref 38–126)
Anion gap: 8 (ref 5–15)
BUN: 14 mg/dL (ref 6–20)
CO2: 25 mmol/L (ref 22–32)
Calcium: 7.9 mg/dL — ABNORMAL LOW (ref 8.9–10.3)
Chloride: 104 mmol/L (ref 98–111)
Creatinine, Ser: 0.74 mg/dL (ref 0.44–1.00)
GFR calc Af Amer: >60 mL/min (ref >60)
GFR calc non Af Amer: >60 mL/min (ref >60)
Glucose, Bld: 91 mg/dL (ref 70–99)
Potassium: 3.9 mmol/L (ref 3.5–5.1)
Sodium: 137 mmol/L (ref 135–145)
Total Bilirubin: 0.5 mg/dL (ref 0.3–1.2)
Total Protein: 5 g/dL — ABNORMAL LOW (ref 6.5–8.1)

## 2019-09-23 MED ORDER — IBUPROFEN 600 MG PO TABS
600.0000 mg | ORAL_TABLET | Freq: Four times a day (QID) | ORAL | Status: DC
  Administered 2019-09-24 (×3): 600 mg via ORAL
  Filled 2019-09-23 (×3): qty 1

## 2019-09-23 MED ORDER — LABETALOL HCL 200 MG PO TABS
500.0000 mg | ORAL_TABLET | Freq: Two times a day (BID) | ORAL | Status: DC
  Administered 2019-09-23 – 2019-09-24 (×2): 500 mg via ORAL
  Filled 2019-09-23 (×2): qty 1

## 2019-09-23 NOTE — Progress Notes (Signed)
    Post Partum Day 4  Subjective: Doing well  Objective: BP (!) 156/74   Pulse 73   Temp 98 F (36.7 C) (Oral)   Resp 18   Ht 5' (1.524 m)   Wt 87.4 kg   LMP 02/05/2019 (Exact Date)   SpO2 99%   Breastfeeding Unknown   BMI 37.63 kg/m        Assessment/Plan: 39 y.o. [id number] postop day # 4  -c/w Dr. Dalbert Garnet - will increase Labetalol to 500mg  BID  Disposition: Continue inpatient postpartum care   LOS: 6 days   , CNM 09/23/2019, 2:46 PM

## 2019-09-23 NOTE — Lactation Note (Signed)
This note was copied from a baby's chart. Lactation Consultation Note  Patient Name: Boy Tinsley Everman DHWYS'H Date: 09/23/2019   Observed mom pumping today.  She has been consistently pumping today and is able to pump larger volumes.  Spoke with [facility] today.  They have a DEBP waiting to give mom when she is discharged after she signs up.  Mom was not discharged home today but is staying another day d/t blood pressure issues.  Lactation to follow up tomorrow before discharge.  Spanish interpreter is needed for communication.  Praised mom for her commitment to continue to pump and supply her breast milk for Liam.  Lactation name and number is on white board and encouraged to call with any questions, concerns or assistance.  Maternal Data    Feeding Feeding Type: Breast Milk  LATCH Score                   Interventions    Lactation Tools Discussed/Used Tools: Pump;38F feeding tube / Syringe   Consult Status      Louis Meckel 09/23/2019, 10:36 PM

## 2019-09-23 NOTE — Progress Notes (Signed)
Post Partum Day 4  Subjective: Doing well, no concerns. Ambulating without difficulty, pain managed with PO meds, tolerating regular diet, and voiding without difficulty.   No fever/chills, chest pain, shortness of breath, nausea/vomiting, or leg pain. No nipple or breast pain. No headache, visual changes, or RUQ/epigastric pain.  Objective: BP (!) 141/79 (BP Location: Right Arm)   Pulse 77   Temp 98 F (36.7 C) (Oral)   Resp 16   Ht 5' (1.524 m)   Wt 87.4 kg   LMP 02/05/2019 (Exact Date)   SpO2 100%   Breastfeeding Unknown   BMI 37.63 kg/m    Physical Exam:  General: alert and cooperative Breasts: soft/nontender CV: RRR Pulm: nl effort Abdomen: soft, non-tender Uterine Fundus: firm Lochia: appropriate DVT Evaluation: No evidence of DVT seen on physical exam. No significant calf/ankle edema.  Recent Labs    09/22/19 0733 09/23/19 0632  HGB 12.9 13.0  HCT 37.2 36.5  WBC 9.8 9.5  PLT 158 172   Results for orders placed or performed during the hospital encounter of 09/17/19 (from the past 24 hour(s))  Comprehensive metabolic panel     Status: Abnormal   Collection Time: 09/23/19  6:32 AM  Result Value Ref Range   Sodium 137 135 - 145 mmol/L   Potassium 3.9 3.5 - 5.1 mmol/L   Chloride 104 98 - 111 mmol/L   CO2 25 22 - 32 mmol/L   Glucose, Bld 91 70 - 99 mg/dL   BUN 14 6 - 20 mg/dL   Creatinine, Ser 5.72 0.44 - 1.00 mg/dL   Calcium 7.9 (L) 8.9 - 10.3 mg/dL   Total Protein 5.0 (L) 6.5 - 8.1 g/dL   Albumin 2.4 (L) 3.5 - 5.0 g/dL   AST 76 (H) 15 - 41 U/L   ALT 112 (H) 0 - 44 U/L   Alkaline Phosphatase 110 38 - 126 U/L   Total Bilirubin 0.5 0.3 - 1.2 mg/dL   GFR calc non Af Amer >60 >60 mL/min   GFR calc Af Amer >60 >60 mL/min   Anion gap 8 5 - 15  CBC     Status: None   Collection Time: 09/23/19  6:32 AM  Result Value Ref Range   WBC 9.5 4.0 - 10.5 K/uL   RBC 4.24 3.87 - 5.11 MIL/uL   Hemoglobin 13.0 12.0 - 15.0 g/dL   HCT 62.0 36 - 46 %   MCV 86.1 80.0 -  100.0 fL   MCH 30.7 26.0 - 34.0 pg   MCHC 35.6 30.0 - 36.0 g/dL   RDW 35.5 97.4 - 16.3 %   Platelets 172 150 - 400 K/uL   nRBC 0.0 0.0 - 0.2 %    Assessment/Plan: 39 y.o. [id number] postop day # 4  -Continue routine postpartum care -Lactation consult PRN for pumping -Will continue on Procardia XL 90mg  daily and increase labetalol to 300mg  PO BID.  -Liver transaminases still elevated but trending down, labs otherwise WNL. No other symptoms. Will c/w Dr. for POC  Disposition: Continue inpatient postpartum care   LOS: 6 days   , CNM 09/23/2019, 8:14 AM

## 2019-09-24 ENCOUNTER — Encounter: Payer: Self-pay | Admitting: Obstetrics and Gynecology

## 2019-09-24 MED ORDER — HYDROCHLOROTHIAZIDE 25 MG PO TABS: 25.0000 mg | ORAL_TABLET | Freq: Every day | ORAL | 0 refills | Status: AC

## 2019-09-24 MED ORDER — IBUPROFEN 600 MG PO TABS: 600.0000 mg | ORAL_TABLET | Freq: Four times a day (QID) | ORAL | 0 refills | Status: AC

## 2019-09-24 MED ORDER — FERROUS SULFATE 325 (65 FE) MG PO TABS: 325.0000 mg | ORAL_TABLET | Freq: Two times a day (BID) | ORAL | 0 refills | Status: AC

## 2019-09-24 MED ORDER — SENNOSIDES-DOCUSATE SODIUM 8.6-50 MG PO TABS: 2.0000 | ORAL_TABLET | ORAL | 0 refills | Status: AC

## 2019-09-24 MED ORDER — LABETALOL HCL 100 MG PO TABS: 500.0000 mg | ORAL_TABLET | Freq: Two times a day (BID) | ORAL | 0 refills | Status: AC

## 2019-09-24 MED ORDER — NIFEDIPINE ER OSMOTIC RELEASE 90 MG PO TB24: 90.0000 mg | ORAL_TABLET | Freq: Every day | ORAL | 0 refills | Status: AC

## 2019-09-24 MED ORDER — HYDROCHLOROTHIAZIDE 25 MG PO TABS
25.0000 mg | ORAL_TABLET | Freq: Every day | ORAL | Status: DC
  Administered 2019-09-24: 25 mg via ORAL
  Filled 2019-09-24: qty 1

## 2019-09-24 NOTE — Progress Notes (Signed)
RN reviewed discharge instructions with patient via interpreter. Pt instructed to pick up her medications at her pharmacy and continue taking medications until her provider states otherwise. Pt instructed when to return to the hospital for evaluation and verbalized understanding of instructions of instructions. Pt to follow up with Dr. Feliberto Gottron on 09/27/19 at 3pm. Pt discharged home in stable condition.

## 2019-09-24 NOTE — Progress Notes (Signed)
Post Partum Day 5 Subjective: Doing well, no complaints.  Tolerating regular diet, pain with PO meds, voiding and ambulating without difficulty. Desires DC home today.   No CP SOB Fever,Chills, N/V or leg pain; denies nipple or breast pain; no HA change of vision, RUQ/epigastric pain  Objective: BP (!) 149/85 (BP Location: Right Arm)   Pulse 66   Temp 98 F (36.7 C)   Resp 18   Ht 5' (1.524 m)   Wt 87.4 kg   LMP 02/05/2019 (Exact Date)   SpO2 99%   Breastfeeding Unknown   BMI 37.63 kg/m    Physical Exam:  General: NAD Breasts: soft/nontender CV: RRR Pulm: nl effort, CTABL Abdomen: soft, NT, BS x 4 Perineum: minimal edema, intact Lochia: scant Uterine Fundus: fundus firm and 1 fb below umbilicus DVT Evaluation: no cords, ttp LEs; 2-3+ bilateral edema  No I/O documented.   Recent Labs    09/22/19 0733 09/23/19 0632  HGB 12.9 13.0  HCT 37.2 36.5  WBC 9.8 9.5  PLT 158 172    Assessment/Plan: 39 y.o. [id number] postpartum day # 5  - Continue routine PP care - BP remain mild range, last labs yesterday- reviewed with Dr Feliberto Gottron.  - will start HCTZ 25mg  daily now, apply compression stockings, plan DC home later today.   - Lactation consult prn, infant in SCN due to prematurity.  - Acute blood loss anemia - hemodynamically stable and asymptomatic; start po ferrous sulfate BID with stool softeners  - Immunization status:  all Imms up to date    Disposition: Does desire Dc home today.     , CNM 09/24/2019  10:40 AM

## 2019-09-24 NOTE — Progress Notes (Signed)
Pt called RN to room; interpreter ipad used; pt's significant other to [facility] to visit baby with pt; pt will visit baby in SCN then be discharged; all discharge instructions already reviewed with pt from RN on previous shift

## 2019-09-24 NOTE — Progress Notes (Signed)
Pt discharged at this time; pt in wheelchair escorted by RN to medical mall; pt going home with significant other

## 2019-10-04 ENCOUNTER — Ambulatory Visit: Payer: Self-pay

## 2019-10-04 NOTE — Lactation Note (Signed)
This note was copied from a baby's chart. Mom with baby in SCN, baby at breast to practice breastfeeding, will latch suck 1-2 times then stop even with EBM placed at mouth with syringe, tires quickly, mom has large, firm nipples, no nipple shield initiated at this time, mom states she is pumping every 3 hrs for 1 hr, but spanish interpreter is coming in and will discuss this with her to determine how long she is pumping at each session, she is using a symphony pump from [facility].

## 2019-10-07 ENCOUNTER — Ambulatory Visit: Payer: Self-pay

## 2019-10-07 NOTE — Lactation Note (Addendum)
This note was copied from a baby's chart. Lactation Consultation Note  Patient Name: Jaime Potts [id number] Date: 10/07/2019   Mom did not pump while she was here today, but did bring in some breast milk for a few feedings.  Mom has breast pump and DEBP kit set up at bedside. Maternal Data    Feeding Feeding Type: Breast Milk  LATCH Score                   Interventions    Lactation Tools Discussed/Used Tools: 87F feeding tube / Syringe   Consult Status      Jarold Motto 10/07/2019, 6:21 PM

## 2019-10-14 ENCOUNTER — Ambulatory Visit: Payer: Self-pay

## 2019-10-14 NOTE — Lactation Note (Signed)
This note was copied from a baby's chart. Lactation Consultation Note  Patient Name: Jaime Potts Date: 10/14/2019    Jaime Potts's feeding order has been changed to ad lib, on demand.  Mom has put him to the breast twice today with him tolerating feeds well.  She is bringing in more expressed breast milk for Jaime Potts.  She is using a Symphony pump that she received through [facility] trying to pump every 3 hours.  Mom denies any questions at this time.  Encouraged mom to call lactation with any questions, concerns or assistance.    Maternal Data    Feeding Feeding Type: Breast Fed  LATCH Score                   Interventions    Lactation Tools Discussed/Used     Consult Status      Louis Meckel 10/14/2019, 7:15 PM

## 2020-10-21 ENCOUNTER — Other Ambulatory Visit: Payer: Self-pay

## 2020-10-21 ENCOUNTER — Ambulatory Visit: Payer: MEDICAID | Attending: Oncology

## 2020-10-21 ENCOUNTER — Ambulatory Visit
Admission: RE | Admit: 2020-10-21 | Discharge: 2020-10-21 | Disposition: A | Discharge status: Home / Self Care | Payer: Self-pay | Source: Ambulatory Visit | Attending: Oncology | Admitting: Oncology

## 2020-10-21 VITALS — BP 141/76 | HR 61 | Ht 61.0 in | Wt 146.0 lb

## 2020-10-21 DIAGNOSIS — Z0000 Encounter for general adult medical examination without abnormal findings: Secondary | ICD-10-CM

## 2020-10-21 NOTE — Progress Notes (Signed)
  Subjective:     Patient ID: Jaime Potts, female   DOB: 1980/06/15, 40 y.o.   MRN: [id number]  HPI   Review of Systems     Objective:   Physical Exam Chest:  Breasts:    Right: No swelling, bleeding, inverted nipple, mass, nipple discharge, skin change or tenderness.     Left: No swelling, bleeding, inverted nipple, mass, nipple discharge, skin change or tenderness.       Assessment:     40 year old patient presents for [facility] clinic visit .  Patient screened, and meets BCCCP eligibility.  Patient does not have insurance, Medicare or Medicaid.  Instructed patient on breast self awareness using teach back method.  Clinical breast exam unremarkable.  No mass or lump palpated.    Plan:     Sent for bilateral screening mammogram.

## 2020-11-01 NOTE — Progress Notes (Unsigned)
Letter mailed from [facility] to notify of normal mammogram results.  Patient to return in one year for annual screening.  Copy to [facility]. 

## 2021-10-20 IMAGING — US US OB COMP +14 WK
1 series · 13 of 28 positions shown · non-contrast
Comparison: none

CLINICAL DATA: Preeclampsia

EXAM:
OBSTETRICAL ULTRASOUND >14 WKS AND BIOPHYSICAL PROFILE

[Series 1: ob us · 48 acquisitions, 13 frames shown]
[im 2/48]
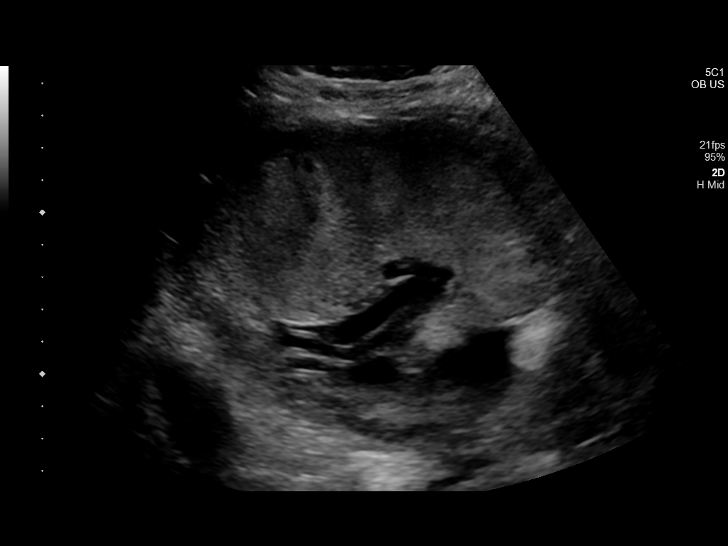
[im 6/48]
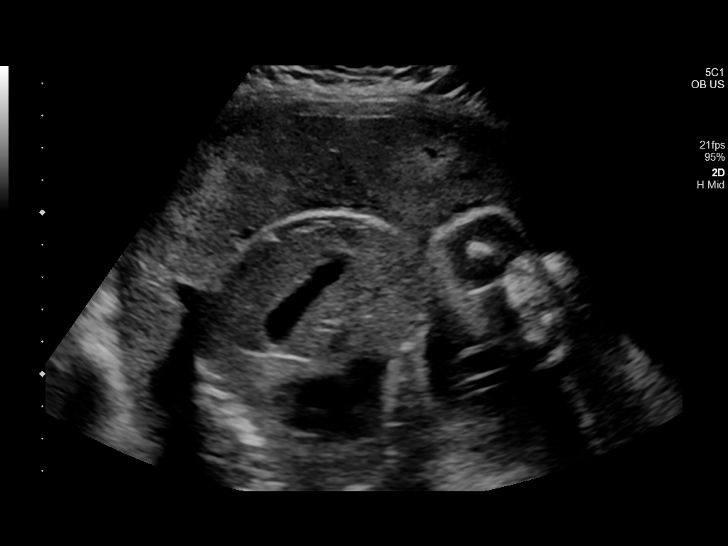
[im 9/48]
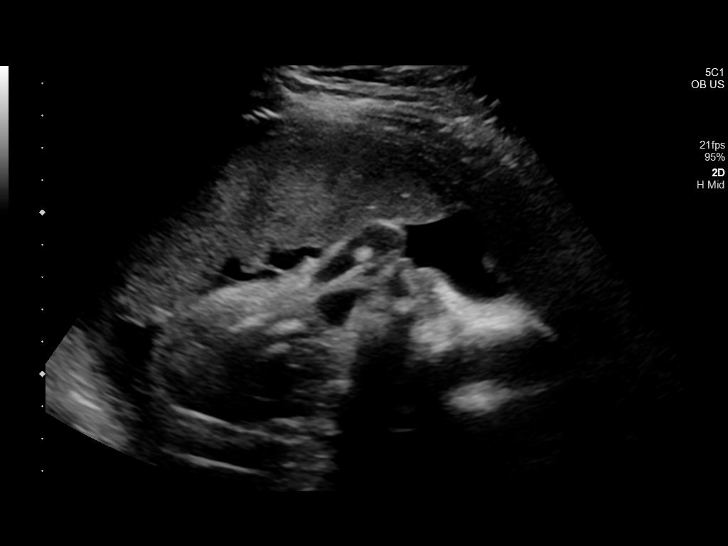
[im 13/48]
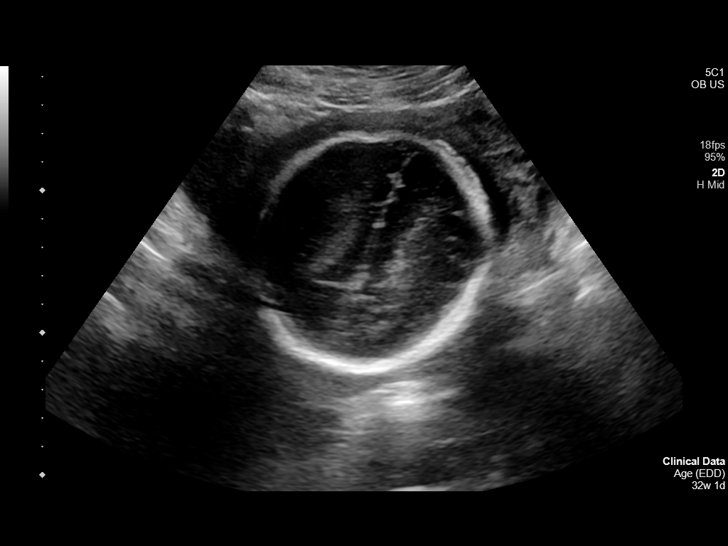
[im 16/48]
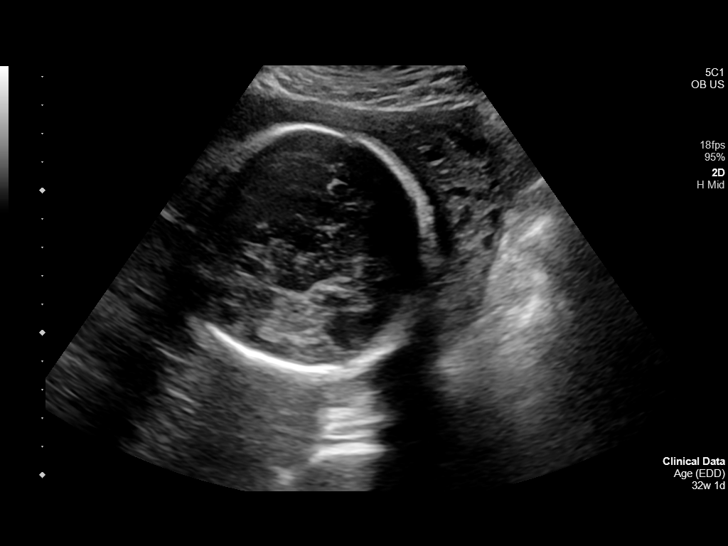
[im 20/48]
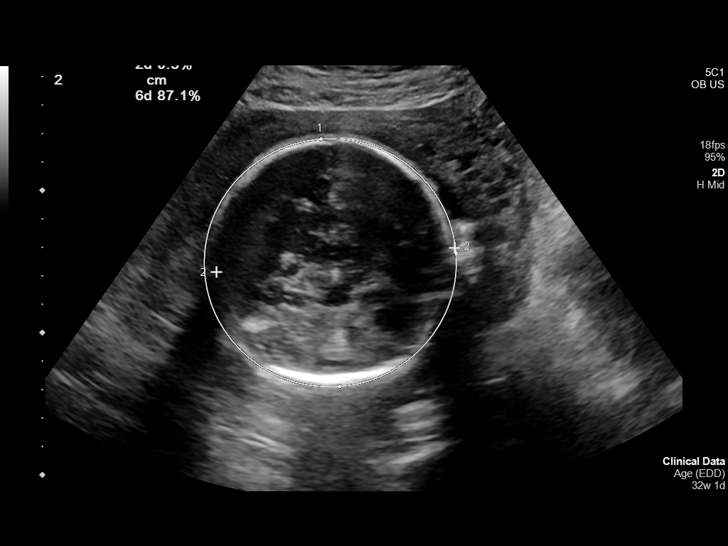
[im 25/48]
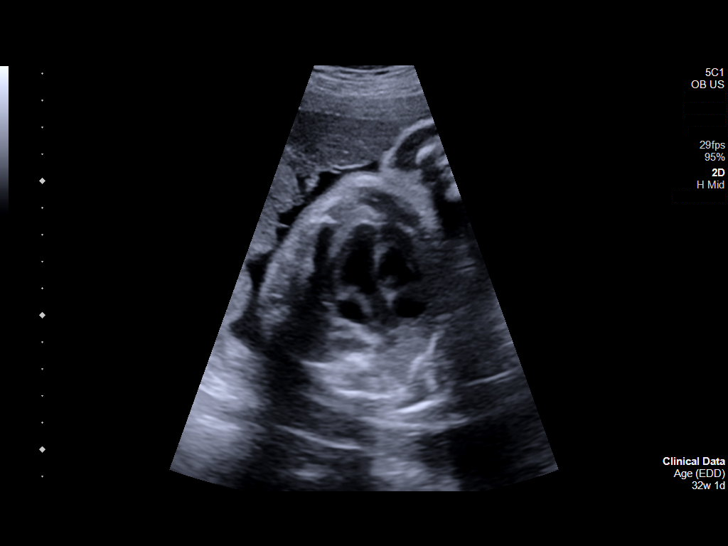
[im 28/48]
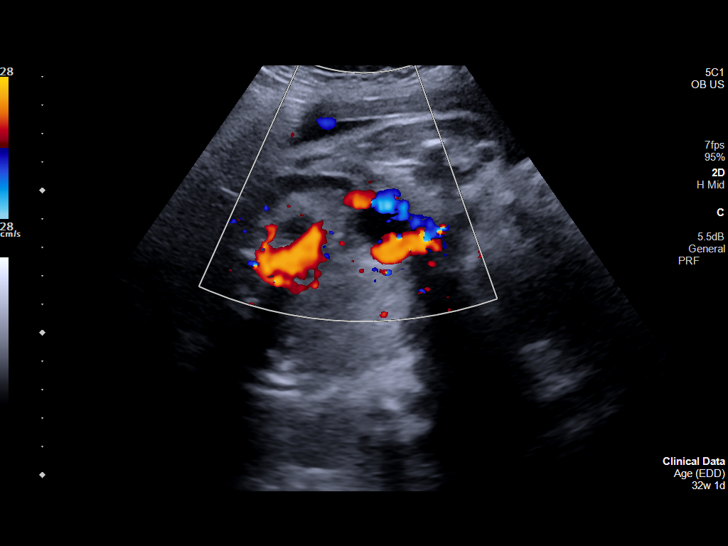
[im 32/48]
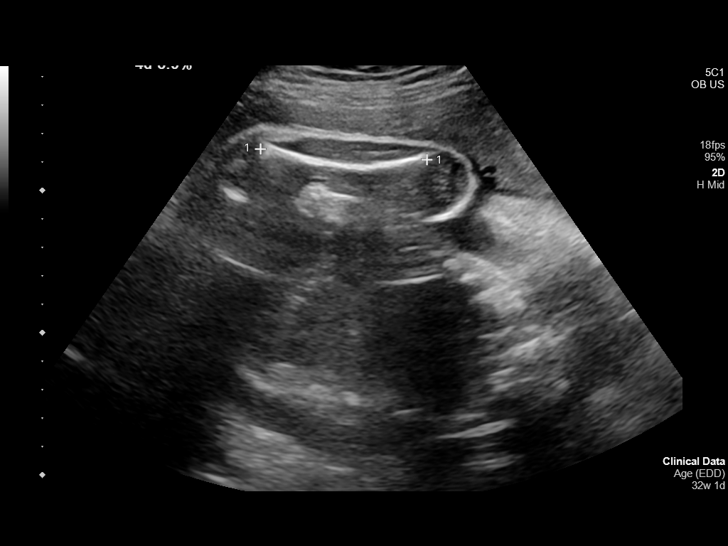
[im 35/48]
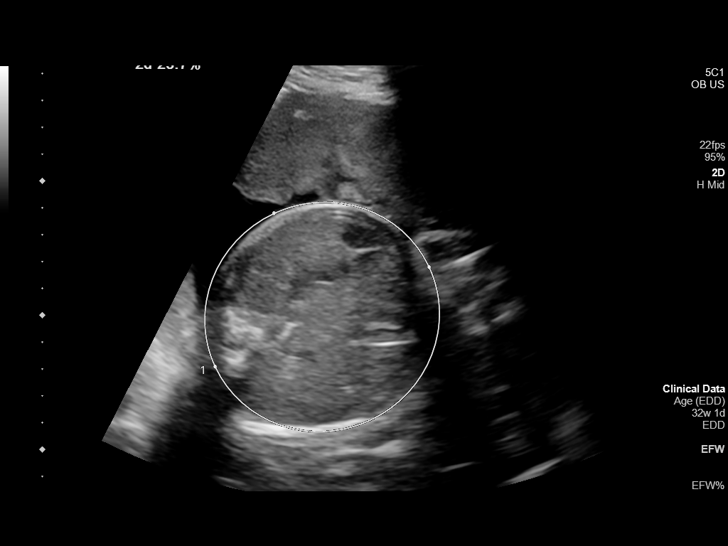
[im 39/48]
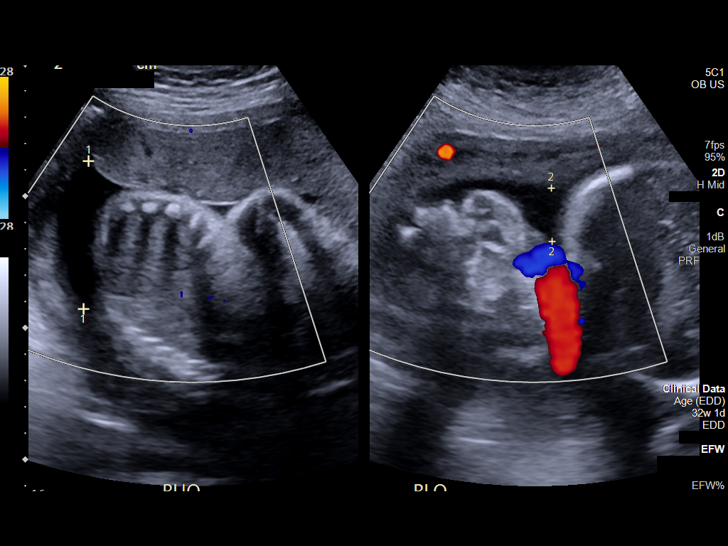
[im 42/48]
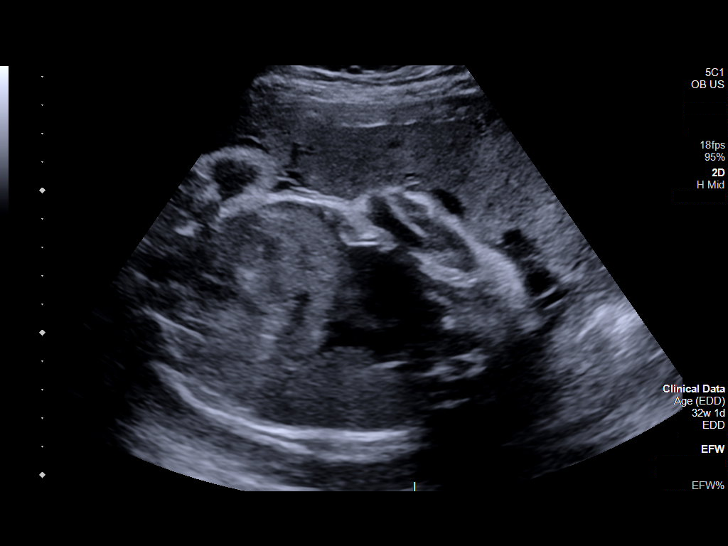
[im 46/48]
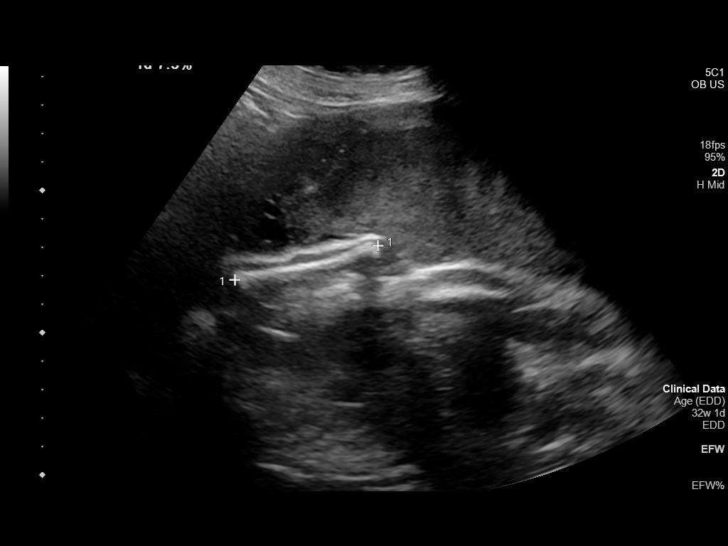

[13 of 28 positions shown; findings below may reference images not displayed]

FINDINGS: Number of Fetuses: 1

Heart Rate:  129 bpm

Movement: Yes

Presentation: Cephalic

Previa: No

Placental Location: Anterior

Amniotic Fluid (Subjective): Normal

Amniotic Fluid (Objective):

Vertical pocket 5.6cm

AFI 13.2 cm (5%ile= 8.6 cm, 95%= 24.2 cm for 32 wks)

FETAL BIOMETRY

BPD: 8.36cm 33w 5d

HC: 27.88cm  30w   4d

AC: 26.85cm  31w   0d

FL: 5.86cm  30w   4d

Current Mean GA: 31w 1d              US EDC: 11/19/2019

Assigned GA: 321w d     Assigned EDC: 11/12/2019

Estimated Fetal Weight: 1681.5g 11.6%ile

FETAL ANATOMY

Lateral Ventricles: Appears normal

Thalami/CSP: Not visualized

Posterior Fossa: Not visualized

Nuchal Region: Not visualized

Upper Lip: Not visualized

Spine: Appears normal

4 Chamber Heart on Left: Appears normal

LVOT: Appears normal

RVOT: Appears normal

Stomach on Left: Appears normal

3 Vessel Cord: Appears normal

Cord Insertion site: Appears normal

Kidneys: Appears normal

Bladder: Appears normal

Extremities: Not visualized

Technical Limitations: Fetal position and advanced gestational age
limits evaluation.

Maternal Findings:

Cervix:  Cervical length 4.3 cm.  Cervix is closed.

BIOPHYSICAL PROFILE

Movement: 2 Time: 30 minutes

Breathing: 0

Tone:  2

Amniotic Fluid: 2

Total Score:  6
IMPRESSION: 1. Single live intrauterine pregnancy as detailed above
2. Biophysical profile score is [DATE].

## 2023-02-22 ENCOUNTER — Encounter: Payer: Self-pay | Admitting: Primary Care

## 2023-03-21 ENCOUNTER — Other Ambulatory Visit: Payer: Self-pay

## 2023-03-21 DIAGNOSIS — Z1231 Encounter for screening mammogram for malignant neoplasm of breast: Secondary | ICD-10-CM

## 2023-04-03 ENCOUNTER — Ambulatory Visit
Admission: RE | Admit: 2023-04-03 | Discharge: 2023-04-03 | Disposition: A | Discharge status: Home / Self Care | Payer: Self-pay | Source: Ambulatory Visit | Attending: Obstetrics and Gynecology | Admitting: Obstetrics and Gynecology

## 2023-04-03 ENCOUNTER — Ambulatory Visit: Payer: Self-pay | Attending: Hematology and Oncology | Admitting: Hematology and Oncology

## 2023-04-03 VITALS — BP 128/72 | Wt 148.4 lb

## 2023-04-03 DIAGNOSIS — Z1231 Encounter for screening mammogram for malignant neoplasm of breast: Secondary | ICD-10-CM | POA: Insufficient documentation

## 2023-04-03 NOTE — Patient Instructions (Signed)
 Taught Jaime Potts about self breast awareness and gave educational materials to take home. Patient did not need a Pap smear today due to last Pap smear was in 02/09/2021 per patient. Told patient about free cervical cancer screenings to receive a Pap smear if would like one next year. Let her know BCCCP will cover Pap smears every 5 years unless has a history of abnormal Pap smears. Referred patient to the [facility] for screening mammogram. Appointment scheduled for 04/03/2023. Patient aware of appointment and will be there. Let patient know will follow up with her within the next couple weeks with results. Jaime Potts verbalized understanding.  Pascal Lux, NP 3:19 PM

## 2023-04-03 NOTE — Progress Notes (Signed)
 Ms. Jaime Potts is a 43 y.o. female who presents to [facility] clinic today with no complaints.    Pap Smear: Pap not smear completed today. Last Pap smear was 02/09/2021 at [facility] clinic and was normal. Per patient has no history of an abnormal Pap smear. Last Pap smear result is available in Epic.   Physical exam: Breasts Breasts symmetrical. No skin abnormalities bilateral breasts. No nipple retraction bilateral breasts. No nipple discharge bilateral breasts. No lymphadenopathy. No lumps palpated bilateral breasts.       Pelvic/Bimanual Pap is not indicated today    Smoking History: Patient has never smoked and was not referred to quit line.    Patient Navigation: Patient education provided. Access to services provided for patient through BCCCP program. Jaime Potts interpreter provided. No transportation provided   Colorectal Cancer Screening: Per patient has never had colonoscopy completed No complaints today.    Breast and Cervical Cancer Risk Assessment: Patient does not have family history of breast cancer, known genetic mutations, or radiation treatment to the chest before age 67. Patient does not have history of cervical dysplasia, immunocompromised, or DES exposure in-utero.  Risk Assessment   No risk assessment data for the current encounter  Risk Scores       10/21/2020   Last edited by: Jaime Presto, RN   5-year risk: 0.7%   Lifetime risk: 13.5%            A: BCCCP exam without pap smear No complaints with benign exam.   P: Referred patient to the [facility] for a screening mammogram. Appointment scheduled 04/03/2023.  Jaime Lux, NP 04/03/2023 3:17 PM
